# Patient Record
Sex: Male | Born: 1945 | Race: White | Hispanic: No | Marital: Single | Smoking: Former smoker
Health system: Southern US, Community
[De-identification: ages and names within clinical notes are randomized; demographics above are authoritative.]

## PROBLEM LIST (undated history)

## (undated) DIAGNOSIS — D649 Anemia, unspecified: Secondary | ICD-10-CM

## (undated) DIAGNOSIS — I2510 Atherosclerotic heart disease of native coronary artery without angina pectoris: Secondary | ICD-10-CM

## (undated) DIAGNOSIS — I10 Essential (primary) hypertension: Secondary | ICD-10-CM

## (undated) DIAGNOSIS — I340 Nonrheumatic mitral (valve) insufficiency: Secondary | ICD-10-CM

## (undated) DIAGNOSIS — J40 Bronchitis, not specified as acute or chronic: Secondary | ICD-10-CM

## (undated) DIAGNOSIS — E7800 Pure hypercholesterolemia, unspecified: Secondary | ICD-10-CM

## (undated) DIAGNOSIS — I5189 Other ill-defined heart diseases: Secondary | ICD-10-CM

## (undated) DIAGNOSIS — K219 Gastro-esophageal reflux disease without esophagitis: Secondary | ICD-10-CM

## (undated) DIAGNOSIS — Z87891 Personal history of nicotine dependence: Secondary | ICD-10-CM

## (undated) DIAGNOSIS — I70219 Atherosclerosis of native arteries of extremities with intermittent claudication, unspecified extremity: Secondary | ICD-10-CM

## (undated) DIAGNOSIS — M1990 Unspecified osteoarthritis, unspecified site: Secondary | ICD-10-CM

## (undated) DIAGNOSIS — I219 Acute myocardial infarction, unspecified: Secondary | ICD-10-CM

## (undated) DIAGNOSIS — I2119 ST elevation (STEMI) myocardial infarction involving other coronary artery of inferior wall: Secondary | ICD-10-CM

## (undated) HISTORY — PX: TONSILLECTOMY: SUR1361

## (undated) HISTORY — PX: CORONARY ANGIOPLASTY WITH STENT PLACEMENT: SHX49

---

## 1994-03-07 HISTORY — PX: ROTATOR CUFF REPAIR: SHX139

## 2013-03-07 DIAGNOSIS — Z9861 Coronary angioplasty status: Secondary | ICD-10-CM

## 2013-03-07 DIAGNOSIS — Z955 Presence of coronary angioplasty implant and graft: Secondary | ICD-10-CM

## 2013-03-07 HISTORY — DX: Presence of coronary angioplasty implant and graft: Z95.5

## 2013-03-07 HISTORY — DX: Coronary angioplasty status: Z98.61

## 2014-01-05 DIAGNOSIS — I219 Acute myocardial infarction, unspecified: Secondary | ICD-10-CM

## 2014-01-05 HISTORY — DX: Acute myocardial infarction, unspecified: I21.9

## 2014-01-26 DIAGNOSIS — I2119 ST elevation (STEMI) myocardial infarction involving other coronary artery of inferior wall: Secondary | ICD-10-CM | POA: Insufficient documentation

## 2014-02-04 DIAGNOSIS — I2510 Atherosclerotic heart disease of native coronary artery without angina pectoris: Secondary | ICD-10-CM | POA: Insufficient documentation

## 2014-02-04 DIAGNOSIS — K219 Gastro-esophageal reflux disease without esophagitis: Secondary | ICD-10-CM | POA: Insufficient documentation

## 2014-02-04 DIAGNOSIS — I10 Essential (primary) hypertension: Secondary | ICD-10-CM | POA: Insufficient documentation

## 2014-03-04 DIAGNOSIS — R079 Chest pain, unspecified: Secondary | ICD-10-CM | POA: Insufficient documentation

## 2014-03-19 DIAGNOSIS — I340 Nonrheumatic mitral (valve) insufficiency: Secondary | ICD-10-CM | POA: Insufficient documentation

## 2014-06-23 DIAGNOSIS — E782 Mixed hyperlipidemia: Secondary | ICD-10-CM | POA: Insufficient documentation

## 2014-09-06 ENCOUNTER — Other Ambulatory Visit: Payer: Self-pay

## 2014-09-06 ENCOUNTER — Emergency Department
Admission: EM | Admit: 2014-09-06 | Discharge: 2014-09-06 | Disposition: A | Discharge status: Home / Self Care | Payer: Medicare Other | Attending: Emergency Medicine | Admitting: Emergency Medicine

## 2014-09-06 ENCOUNTER — Encounter: Payer: Self-pay | Admitting: Emergency Medicine

## 2014-09-06 DIAGNOSIS — Z87891 Personal history of nicotine dependence: Secondary | ICD-10-CM | POA: Insufficient documentation

## 2014-09-06 DIAGNOSIS — R42 Dizziness and giddiness: Secondary | ICD-10-CM | POA: Diagnosis not present

## 2014-09-06 DIAGNOSIS — I252 Old myocardial infarction: Secondary | ICD-10-CM | POA: Insufficient documentation

## 2014-09-06 DIAGNOSIS — Z7902 Long term (current) use of antithrombotics/antiplatelets: Secondary | ICD-10-CM | POA: Diagnosis not present

## 2014-09-06 DIAGNOSIS — Z7982 Long term (current) use of aspirin: Secondary | ICD-10-CM | POA: Insufficient documentation

## 2014-09-06 DIAGNOSIS — Z79899 Other long term (current) drug therapy: Secondary | ICD-10-CM | POA: Diagnosis not present

## 2014-09-06 HISTORY — DX: Pure hypercholesterolemia, unspecified: E78.00

## 2014-09-06 HISTORY — DX: Acute myocardial infarction, unspecified: I21.9

## 2014-09-06 LAB — COMPREHENSIVE METABOLIC PANEL
ALT: 24 U/L (ref 17–63)
AST: 32 U/L (ref 15–41)
Albumin: 4 g/dL (ref 3.5–5.0)
Alkaline Phosphatase: 68 U/L (ref 38–126)
Anion gap: 6 (ref 5–15)
BUN: 11 mg/dL (ref 6–20)
CO2: 28 mmol/L (ref 22–32)
CREATININE: 0.89 mg/dL (ref 0.61–1.24)
Calcium: 9.3 mg/dL (ref 8.9–10.3)
Chloride: 108 mmol/L (ref 101–111)
GFR calc Af Amer: >60 mL/min (ref >60)
GFR calc non Af Amer: >60 mL/min (ref >60)
GLUCOSE: 100 mg/dL — AB (ref 65–99)
POTASSIUM: 4.3 mmol/L (ref 3.5–5.1)
Sodium: 142 mmol/L (ref 135–145)
TOTAL PROTEIN: 6.8 g/dL (ref 6.5–8.1)
Total Bilirubin: 0.9 mg/dL (ref 0.3–1.2)

## 2014-09-06 LAB — TROPONIN I: Troponin I: <0.03 ng/mL (ref <0.031)

## 2014-09-06 LAB — CBC
HCT: 42.8 % (ref 40.0–52.0)
HEMOGLOBIN: 14 g/dL (ref 13.0–18.0)
MCH: 31.8 pg (ref 26.0–34.0)
MCHC: 32.7 g/dL (ref 32.0–36.0)
MCV: 97.2 fL (ref 80.0–100.0)
Platelets: 192 10*3/uL (ref 150–440)
RBC: 4.4 MIL/uL (ref 4.40–5.90)
RDW: 14 % (ref 11.5–14.5)
WBC: 7.4 10*3/uL (ref 3.8–10.6)

## 2014-09-06 MED ORDER — SODIUM CHLORIDE 0.9 % IV SOLN
1000.0000 mL | Freq: Once | INTRAVENOUS | Status: AC
  Administered 2014-09-06: 1000 mL via INTRAVENOUS

## 2014-09-06 NOTE — ED Notes (Signed)
Patient arrives to [facility] ED with spouse via POV. C/o dizziness and blurred vision. Patient has recent stent placement (Nov 2015) and is on plavix/asa

## 2014-09-06 NOTE — Discharge Instructions (Signed)

## 2014-09-06 NOTE — ED Provider Notes (Signed)
[facility] Emergency Department Provider Note  ____________________________________________  Time seen: On arrival  I have reviewed the triage vital signs and the nursing notes.   HISTORY  Chief Complaint Dizziness    HPI Cody Fritz is a 69 y.o. male who presents with complaints of dizziness. He first noticed this when he got up this morning. When he stood up he felt lightheaded so he rested for a little bit and then went to drink some coffee. He denies chest pain shortness of breath. He notes mild lightheadedness when he stands and reports that he has had this several times in the past 5 years. He denies headache, he denies focal deficits area no fevers no chills. Otherwise he feels well and he is lying down. Reports compliance with his medications     Past Medical History  Diagnosis Date  . High cholesterol   . MI (myocardial infarction)   . Cancer     There are no active problems to display for this patient.   Past Surgical History  Procedure Laterality Date  . Coronary angioplasty with stent placement      Current Outpatient Rx  Name  Route  Sig  Dispense  Refill  . aspirin EC 81 MG tablet   Oral   Take 81 mg by mouth at bedtime.         Marland Kitchen atorvastatin (LIPITOR) 80 MG tablet   Oral   Take 80 mg by mouth at bedtime.         . clopidogrel (PLAVIX) 75 MG tablet   Oral   Take 75 mg by mouth daily.         Marland Kitchen lisinopril (PRINIVIL,ZESTRIL) 5 MG tablet   Oral   Take 5 mg by mouth daily.         . metoprolol tartrate (LOPRESSOR) 25 MG tablet   Oral   Take 6.25 mg by mouth 2 (two) times daily.         Marland Kitchen zolpidem (AMBIEN) 5 MG tablet   Oral   Take 5 mg by mouth at bedtime as needed for sleep.            Allergies Review of patient's allergies indicates no known allergies.  History reviewed. No pertinent family history.  Social History History  Substance Use Topics  . Smoking status: Former Smoker    Quit date:  01/06/2014  . Smokeless tobacco: Not on file  . Alcohol Use: No    Review of Systems  Constitutional: Negative for fever. Eyes: Negative for visual changes. ENT: Negative for sore throat Cardiovascular: Negative for chest pain. Respiratory: Negative for shortness of breath. Gastrointestinal: Negative for abdominal pain, vomiting and diarrhea. Genitourinary: Negative for dysuria. Musculoskeletal: Negative for back pain. Skin: Negative for rash. Neurological: Negative for headaches or focal weakness Psychiatric: No anxiety  10-point ROS otherwise negative.  ____________________________________________   PHYSICAL EXAM:  VITAL SIGNS: ED Triage Vitals  Enc Vitals Group     BP 09/06/14 0942 141/70 mmHg     Pulse Rate 09/06/14 0942 71     Resp 09/06/14 0942 18     Temp 09/06/14 0942 98.3 F (36.8 C)     Temp Source 09/06/14 0942 Oral     SpO2 09/06/14 0942 97 %     Weight 09/06/14 0942 190 lb (86.183 kg)     Height 09/06/14 0942 5\' 9"  (1.753 m)     Head Cir --      Peak Flow --  Pain Score --      Pain Loc --      Pain Edu? --      Excl. in [place]? --      Constitutional: Alert and oriented. Well appearing and in no distress. Eyes: Conjunctivae are normal.  ENT   Head: Normocephalic and atraumatic.   Mouth/Throat: Mucous membranes are moist. Cardiovascular: Normal rate, regular rhythm. Normal and symmetric distal pulses are present in all extremities. No murmurs, rubs, or gallops. Respiratory: Normal respiratory effort without tachypnea nor retractions. Breath sounds are clear and equal bilaterally.  Gastrointestinal: Soft and non-tender in all quadrants. No distention. There is no CVA tenderness. Genitourinary: deferred Musculoskeletal: Nontender with normal range of motion in all extremities. No lower extremity tenderness nor edema. Neurologic:  Normal speech and language. No gross focal neurologic deficits are appreciated. Skin:  Skin is warm, dry and  intact. No rash noted. Psychiatric: Mood and affect are normal. Patient exhibits appropriate insight and judgment.  ____________________________________________    LABS (pertinent positives/negatives)  Labs Reviewed  COMPREHENSIVE METABOLIC PANEL - Abnormal; Notable for the following:    Glucose, Bld 100 (*)    All other components within normal limits  CBC  TROPONIN I    ____________________________________________   EKG  ED ECG REPORT I, Lavonia Drafts, the attending physician, personally viewed and interpreted this ECG.  Date: 09/06/2014 EKG Time: 9:53 AM Rate: 78 Rhythm: normal sinus rhythm QRS Axis: normal Intervals: normal ST/T Wave abnormalities: normal Conduction Disutrbances: none Narrative Interpretation: unremarkable   ____________________________________________    RADIOLOGY I have personally reviewed any xrays that were ordered on this patient:  None  ____________________________________________   PROCEDURES  Procedure(s) performed: none  Critical Care performed: none  ____________________________________________   INITIAL IMPRESSION / ASSESSMENT AND PLAN / ED COURSE  Pertinent labs & imaging results that were available during my care of the patient were reviewed by me and considered in my medical decision making (see chart for details).  Patient well-appearing and in no acute distress. Benign exam. We will obtain blood work and give 1 L normal saline IV and check orthostatics ----------------------------------------- 3:00 PM on 09/06/2014 -----------------------------------------  Patient observed in the ED 4 hours. No arrhythmias on cardiac monitor, normal blood pressure, orthostatics have improved with IV fluid. Patient has no interest in staying in the hospital and I think this is reasonable given normal EKG and normal monitoring, normal troponin, electrolytes, and normal blood work. He will return if there is any change in his  symptoms or worsening of his symptoms otherwise he will follow-up with his primary care physician. ____________________________________________   FINAL CLINICAL IMPRESSION(S) / ED DIAGNOSES  Final diagnoses:  Dizziness     Lavonia Drafts, MD 09/06/14 1501

## 2015-02-19 DIAGNOSIS — M160 Bilateral primary osteoarthritis of hip: Secondary | ICD-10-CM | POA: Insufficient documentation

## 2015-04-01 DIAGNOSIS — M1611 Unilateral primary osteoarthritis, right hip: Secondary | ICD-10-CM | POA: Insufficient documentation

## 2015-06-22 DIAGNOSIS — D649 Anemia, unspecified: Secondary | ICD-10-CM | POA: Insufficient documentation

## 2015-06-22 DIAGNOSIS — E7800 Pure hypercholesterolemia, unspecified: Secondary | ICD-10-CM | POA: Insufficient documentation

## 2015-10-26 DIAGNOSIS — Z9861 Coronary angioplasty status: Secondary | ICD-10-CM

## 2015-10-26 DIAGNOSIS — I2510 Atherosclerotic heart disease of native coronary artery without angina pectoris: Secondary | ICD-10-CM | POA: Insufficient documentation

## 2016-02-10 ENCOUNTER — Other Ambulatory Visit: Payer: Self-pay | Admitting: Family Medicine

## 2016-02-10 DIAGNOSIS — M25512 Pain in left shoulder: Secondary | ICD-10-CM

## 2016-02-11 ENCOUNTER — Ambulatory Visit
Admission: RE | Admit: 2016-02-11 | Discharge: 2016-02-11 | Disposition: A | Discharge status: Home / Self Care | Payer: Medicare Other | Source: Ambulatory Visit | Attending: Family Medicine | Admitting: Family Medicine

## 2016-02-11 DIAGNOSIS — M12812 Other specific arthropathies, not elsewhere classified, left shoulder: Secondary | ICD-10-CM | POA: Diagnosis not present

## 2016-02-11 DIAGNOSIS — R600 Localized edema: Secondary | ICD-10-CM | POA: Diagnosis not present

## 2016-02-11 DIAGNOSIS — M25412 Effusion, left shoulder: Secondary | ICD-10-CM | POA: Diagnosis not present

## 2016-02-11 DIAGNOSIS — S46812A Strain of other muscles, fascia and tendons at shoulder and upper arm level, left arm, initial encounter: Secondary | ICD-10-CM | POA: Diagnosis not present

## 2016-02-11 DIAGNOSIS — X58XXXA Exposure to other specified factors, initial encounter: Secondary | ICD-10-CM | POA: Diagnosis not present

## 2016-02-11 DIAGNOSIS — M75112 Incomplete rotator cuff tear or rupture of left shoulder, not specified as traumatic: Secondary | ICD-10-CM | POA: Insufficient documentation

## 2016-02-11 DIAGNOSIS — M25512 Pain in left shoulder: Secondary | ICD-10-CM | POA: Insufficient documentation

## 2016-02-25 DIAGNOSIS — M25551 Pain in right hip: Secondary | ICD-10-CM

## 2016-02-25 DIAGNOSIS — G8929 Other chronic pain: Secondary | ICD-10-CM | POA: Insufficient documentation

## 2016-03-10 ENCOUNTER — Encounter
Admission: RE | Admit: 2016-03-10 | Discharge: 2016-03-10 | Disposition: A | Discharge status: Home / Self Care | Payer: Medicare Other | Source: Ambulatory Visit | Attending: Surgery | Admitting: Surgery

## 2016-03-10 DIAGNOSIS — I2510 Atherosclerotic heart disease of native coronary artery without angina pectoris: Secondary | ICD-10-CM | POA: Insufficient documentation

## 2016-03-10 HISTORY — DX: Essential (primary) hypertension: I10

## 2016-03-10 HISTORY — DX: Atherosclerotic heart disease of native coronary artery without angina pectoris: I25.10

## 2016-03-10 HISTORY — DX: Anemia, unspecified: D64.9

## 2016-03-10 HISTORY — DX: Gastro-esophageal reflux disease without esophagitis: K21.9

## 2016-03-10 HISTORY — DX: Unspecified osteoarthritis, unspecified site: M19.90

## 2016-03-10 NOTE — Patient Instructions (Signed)
  Your procedure is scheduled on: March 17, 2016 (Thursday) Report to Same Day Surgery 2nd floor medical mall [facility] Entrance-take elevator on left to 2nd floor.  Check in with surgery information desk.) To find out your arrival time please call [phone] between 1PM - 3PM on  March 16, 2016 (Wednesday)  Remember: Instructions that are not followed completely may result in serious medical risk, up to and including death, or upon the discretion of your surgeon and anesthesiologist your surgery may need to be rescheduled.    _x___ 1. Do not eat food or drink liquids after midnight. No gum chewing or hard candies.     __x__ 2. No Alcohol for 24 hours before or after surgery.   __x__3. No Smoking for 24 prior to surgery.   ____  4. Bring all medications with you on the day of surgery if instructed.    __x__ 5. Notify your doctor if there is any change in your medical condition     (cold, fever, infections).     Do not wear jewelry, make-up, hairpins, clips or nail polish.  Do not wear lotions, powders, or perfumes. You may wear deodorant.  Do not shave 48 hours prior to surgery. Men may shave face and neck.  Do not bring valuables to the hospital.    [facility] is not responsible for any belongings or valuables.               Contacts, dentures or bridgework may not be worn into surgery.  Leave your suitcase in the car. After surgery it may be brought to your room.  For patients admitted to the hospital, discharge time is determined by your treatment team.   Patients discharged the day of surgery will not be allowed to drive home.  You will need someone to drive you home and stay with you the night of your procedure.    Please read over the following fact sheets that you were given:   [facility] Preparing for Surgery and or MRSA Information   _x___ Take these medicines the morning of surgery with A SIP OF WATER:    1. Carvedilol  2.  3.  4.  5.  6.  ____Fleets  enema or Magnesium Citrate as directed.   _x___ Use CHG Soap or sage wipes as directed on instruction sheet   ____ Use inhalers on the day of surgery and bring to hospital day of surgery  ____ Stop metformin 2 days prior to surgery    ____ Take 1/2 of usual insulin dose the night before surgery and none on the morning of           surgery.   _x___ Stop Aspirin, Coumadin, Pllavix ,Eliquis, Effient, or Pradaxa (Call Dr. Roland Rack office to find out when to stop Aspirin and Plavix)  x__ Stop Anti-inflammatories such as Advil, Aleve, Ibuprofen, Motrin, Naproxen,          Naprosyn, Goodies powders or aspirin products. Ok to take Tylenol.   __x__ Stop supplements until after surgery.  (Stop Vitamin A, E, CO Q 10, Fish Oil, Men's One A Day , and Glucosamine      ____ Bring C-Pap to the hospital.

## 2016-03-10 NOTE — Pre-Procedure Instructions (Signed)
PREOP EKG FAXED TO DR [name] FOR REVIEW. CLEARED 02/10/16 BY DR [name]

## 2016-03-11 NOTE — Pre-Procedure Instructions (Signed)
EKG REVIEWED BY DR [name] 03/11/16 AND STILL CLEARED  FOR SURGERY

## 2016-03-17 ENCOUNTER — Ambulatory Visit: Payer: Medicare Other | Admitting: Certified Registered Nurse Anesthetist

## 2016-03-17 ENCOUNTER — Ambulatory Visit
Admission: RE | Admit: 2016-03-17 | Discharge: 2016-03-17 | Disposition: A | Discharge status: Home / Self Care | Payer: Medicare Other | Source: Ambulatory Visit | Attending: Surgery | Admitting: Surgery

## 2016-03-17 ENCOUNTER — Encounter: Payer: Self-pay | Admitting: *Deleted

## 2016-03-17 ENCOUNTER — Encounter
Admission: RE | Disposition: A | Discharge status: Home / Self Care | Payer: Self-pay | Source: Ambulatory Visit | Attending: Surgery

## 2016-03-17 DIAGNOSIS — E7800 Pure hypercholesterolemia, unspecified: Secondary | ICD-10-CM | POA: Insufficient documentation

## 2016-03-17 DIAGNOSIS — Z7982 Long term (current) use of aspirin: Secondary | ICD-10-CM | POA: Insufficient documentation

## 2016-03-17 DIAGNOSIS — M1990 Unspecified osteoarthritis, unspecified site: Secondary | ICD-10-CM | POA: Diagnosis not present

## 2016-03-17 DIAGNOSIS — Z87891 Personal history of nicotine dependence: Secondary | ICD-10-CM | POA: Insufficient documentation

## 2016-03-17 DIAGNOSIS — I10 Essential (primary) hypertension: Secondary | ICD-10-CM | POA: Diagnosis not present

## 2016-03-17 DIAGNOSIS — K219 Gastro-esophageal reflux disease without esophagitis: Secondary | ICD-10-CM | POA: Insufficient documentation

## 2016-03-17 DIAGNOSIS — W010XXA Fall on same level from slipping, tripping and stumbling without subsequent striking against object, initial encounter: Secondary | ICD-10-CM | POA: Diagnosis not present

## 2016-03-17 DIAGNOSIS — S46012A Strain of muscle(s) and tendon(s) of the rotator cuff of left shoulder, initial encounter: Secondary | ICD-10-CM | POA: Diagnosis present

## 2016-03-17 DIAGNOSIS — I252 Old myocardial infarction: Secondary | ICD-10-CM | POA: Insufficient documentation

## 2016-03-17 DIAGNOSIS — M7522 Bicipital tendinitis, left shoulder: Secondary | ICD-10-CM | POA: Insufficient documentation

## 2016-03-17 DIAGNOSIS — E785 Hyperlipidemia, unspecified: Secondary | ICD-10-CM | POA: Diagnosis not present

## 2016-03-17 DIAGNOSIS — I2510 Atherosclerotic heart disease of native coronary artery without angina pectoris: Secondary | ICD-10-CM | POA: Insufficient documentation

## 2016-03-17 DIAGNOSIS — M65862 Other synovitis and tenosynovitis, left lower leg: Secondary | ICD-10-CM | POA: Insufficient documentation

## 2016-03-17 HISTORY — PX: SHOULDER ARTHROSCOPY WITH OPEN ROTATOR CUFF REPAIR: SHX6092

## 2016-03-17 SURGERY — ARTHROSCOPY, SHOULDER WITH REPAIR, ROTATOR CUFF, OPEN
Anesthesia: Regional | Site: Shoulder | Laterality: Left | Wound class: Clean

## 2016-03-17 MED ORDER — LIDOCAINE 2% (20 MG/ML) 5 ML SYRINGE
INTRAMUSCULAR | Status: AC
  Filled 2016-03-17: qty 5

## 2016-03-17 MED ORDER — ROPIVACAINE HCL 5 MG/ML IJ SOLN
INTRAMUSCULAR | Status: AC
Start: 2016-03-17 — End: 2016-03-17
  Filled 2016-03-17: qty 40

## 2016-03-17 MED ORDER — BUPIVACAINE HCL (PF) 0.5 % IJ SOLN
INTRAMUSCULAR | Status: AC
  Filled 2016-03-17: qty 30

## 2016-03-17 MED ORDER — DEXAMETHASONE SODIUM PHOSPHATE 10 MG/ML IJ SOLN
INTRAMUSCULAR | Status: AC
  Filled 2016-03-17: qty 1

## 2016-03-17 MED ORDER — BUPIVACAINE-EPINEPHRINE 0.5% -1:200000 IJ SOLN
INTRAMUSCULAR | Status: DC | PRN
Start: 2016-03-17 — End: 2016-03-17
  Administered 2016-03-17: 30 mL

## 2016-03-17 MED ORDER — ONDANSETRON HCL 4 MG PO TABS: 4.0000 mg | ORAL_TABLET | Freq: Four times a day (QID) | ORAL | Status: DC | PRN

## 2016-03-17 MED ORDER — SUGAMMADEX SODIUM 200 MG/2ML IV SOLN
INTRAVENOUS | Status: DC | PRN
  Administered 2016-03-17: 200 mg via INTRAVENOUS

## 2016-03-17 MED ORDER — ROCURONIUM BROMIDE 100 MG/10ML IV SOLN
INTRAVENOUS | Status: DC | PRN
  Administered 2016-03-17: 50 mg via INTRAVENOUS

## 2016-03-17 MED ORDER — EPINEPHRINE PF 1 MG/ML IJ SOLN
INTRAMUSCULAR | Status: AC
  Filled 2016-03-17: qty 3

## 2016-03-17 MED ORDER — PROPOFOL 10 MG/ML IV BOLUS
INTRAVENOUS | Status: AC
  Filled 2016-03-17: qty 20

## 2016-03-17 MED ORDER — EPHEDRINE 5 MG/ML INJ
INTRAVENOUS | Status: AC
  Filled 2016-03-17: qty 10

## 2016-03-17 MED ORDER — FAMOTIDINE 20 MG PO TABS
ORAL_TABLET | ORAL | Status: AC
  Administered 2016-03-17: 20 mg via ORAL
  Filled 2016-03-17: qty 1

## 2016-03-17 MED ORDER — ROCURONIUM BROMIDE 50 MG/5ML IV SOSY
PREFILLED_SYRINGE | INTRAVENOUS | Status: AC
  Filled 2016-03-17: qty 5

## 2016-03-17 MED ORDER — MIDAZOLAM HCL 2 MG/2ML IJ SOLN
INTRAMUSCULAR | Status: AC
  Administered 2016-03-17: 2 mg via INTRAVENOUS
  Filled 2016-03-17: qty 2

## 2016-03-17 MED ORDER — LACTATED RINGERS IV SOLN
INTRAVENOUS | Status: DC | PRN
  Administered 2016-03-17: 10:00:00 via INTRAVENOUS

## 2016-03-17 MED ORDER — MIDAZOLAM HCL 2 MG/2ML IJ SOLN
INTRAMUSCULAR | Status: AC
  Filled 2016-03-17: qty 2

## 2016-03-17 MED ORDER — OXYCODONE HCL 5 MG PO TABS: 5.0000 mg | ORAL_TABLET | ORAL | 0 refills | Status: DC | PRN

## 2016-03-17 MED ORDER — ONDANSETRON HCL 4 MG/2ML IJ SOLN: 4.0000 mg | Freq: Once | INTRAMUSCULAR | Status: DC | PRN

## 2016-03-17 MED ORDER — PHENYLEPHRINE HCL 10 MG/ML IJ SOLN
INTRAMUSCULAR | Status: DC | PRN
  Administered 2016-03-17 (×3): 100 ug via INTRAVENOUS

## 2016-03-17 MED ORDER — MIDAZOLAM HCL 2 MG/2ML IJ SOLN
2.0000 mg | Freq: Once | INTRAMUSCULAR | Status: AC
  Administered 2016-03-17: 2 mg via INTRAVENOUS

## 2016-03-17 MED ORDER — ONDANSETRON HCL 4 MG/2ML IJ SOLN
INTRAMUSCULAR | Status: AC
  Filled 2016-03-17: qty 2

## 2016-03-17 MED ORDER — DEXAMETHASONE SODIUM PHOSPHATE 10 MG/ML IJ SOLN
INTRAMUSCULAR | Status: DC | PRN
  Administered 2016-03-17: 10 mg via INTRAVENOUS

## 2016-03-17 MED ORDER — MIDAZOLAM HCL 2 MG/2ML IJ SOLN
INTRAMUSCULAR | Status: DC | PRN
  Administered 2016-03-17: 2 mg via INTRAVENOUS

## 2016-03-17 MED ORDER — LACTATED RINGERS IV SOLN: INTRAVENOUS | Status: DC

## 2016-03-17 MED ORDER — FAMOTIDINE 20 MG PO TABS
20.0000 mg | ORAL_TABLET | Freq: Once | ORAL | Status: AC
  Administered 2016-03-17: 20 mg via ORAL

## 2016-03-17 MED ORDER — SUGAMMADEX SODIUM 200 MG/2ML IV SOLN
INTRAVENOUS | Status: AC
  Filled 2016-03-17: qty 2

## 2016-03-17 MED ORDER — CEFAZOLIN SODIUM-DEXTROSE 2-4 GM/100ML-% IV SOLN
2.0000 g | Freq: Once | INTRAVENOUS | Status: AC
  Administered 2016-03-17: 2 g via INTRAVENOUS

## 2016-03-17 MED ORDER — EPINEPHRINE PF 1 MG/ML IJ SOLN
INTRAMUSCULAR | Status: DC | PRN
  Administered 2016-03-17: 1 mg

## 2016-03-17 MED ORDER — OXYCODONE HCL 5 MG PO TABS: 5.0000 mg | ORAL_TABLET | ORAL | Status: DC | PRN

## 2016-03-17 MED ORDER — CEFAZOLIN SODIUM-DEXTROSE 2-4 GM/100ML-% IV SOLN
INTRAVENOUS | Status: AC
  Filled 2016-03-17: qty 100

## 2016-03-17 MED ORDER — EPHEDRINE SULFATE 50 MG/ML IJ SOLN
INTRAMUSCULAR | Status: DC | PRN
  Administered 2016-03-17 (×5): 10 mg via INTRAVENOUS

## 2016-03-17 MED ORDER — ONDANSETRON HCL 4 MG/2ML IJ SOLN
INTRAMUSCULAR | Status: DC | PRN
  Administered 2016-03-17: 4 mg via INTRAVENOUS

## 2016-03-17 MED ORDER — ONDANSETRON HCL 4 MG/2ML IJ SOLN: 4.0000 mg | Freq: Four times a day (QID) | INTRAMUSCULAR | Status: DC | PRN

## 2016-03-17 MED ORDER — PROPOFOL 10 MG/ML IV BOLUS
INTRAVENOUS | Status: DC | PRN
Start: 2016-03-17 — End: 2016-03-17
  Administered 2016-03-17: 200 mg via INTRAVENOUS

## 2016-03-17 MED ORDER — LIDOCAINE HCL (PF) 1 % IJ SOLN
INTRAMUSCULAR | Status: AC
  Filled 2016-03-17: qty 5

## 2016-03-17 MED ORDER — FENTANYL CITRATE (PF) 100 MCG/2ML IJ SOLN
INTRAMUSCULAR | Status: AC
  Filled 2016-03-17: qty 2

## 2016-03-17 MED ORDER — LIDOCAINE HCL (PF) 1 % IJ SOLN
INTRAMUSCULAR | Status: DC | PRN
  Administered 2016-03-17: 5 mL via SUBCUTANEOUS

## 2016-03-17 MED ORDER — METOCLOPRAMIDE HCL 5 MG/ML IJ SOLN: 5.0000 mg | Freq: Three times a day (TID) | INTRAMUSCULAR | Status: DC | PRN

## 2016-03-17 MED ORDER — POTASSIUM CHLORIDE IN NACL 20-0.9 MEQ/L-% IV SOLN
INTRAVENOUS | Status: DC
  Filled 2016-03-17 (×3): qty 1000

## 2016-03-17 MED ORDER — PHENYLEPHRINE 40 MCG/ML (10ML) SYRINGE FOR IV PUSH (FOR BLOOD PRESSURE SUPPORT)
PREFILLED_SYRINGE | INTRAVENOUS | Status: AC
  Filled 2016-03-17: qty 10

## 2016-03-17 MED ORDER — ROPIVACAINE HCL 5 MG/ML IJ SOLN
INTRAMUSCULAR | Status: DC | PRN
  Administered 2016-03-17: 30 mL via PERINEURAL

## 2016-03-17 MED ORDER — FENTANYL CITRATE (PF) 100 MCG/2ML IJ SOLN: 25.0000 ug | INTRAMUSCULAR | Status: DC | PRN

## 2016-03-17 MED ORDER — METOCLOPRAMIDE HCL 10 MG PO TABS: 5.0000 mg | ORAL_TABLET | Freq: Three times a day (TID) | ORAL | Status: DC | PRN

## 2016-03-17 MED ORDER — FENTANYL CITRATE (PF) 100 MCG/2ML IJ SOLN
INTRAMUSCULAR | Status: DC | PRN
  Administered 2016-03-17: 100 ug via INTRAVENOUS

## 2016-03-17 MED ORDER — LIDOCAINE HCL (CARDIAC) 20 MG/ML IV SOLN
INTRAVENOUS | Status: DC | PRN
  Administered 2016-03-17: 100 mg via INTRAVENOUS

## 2016-03-17 SURGICAL SUPPLY — 44 items
ANCHOR JUGGERKNOT WTAP NDL 2.9 (Anchor) ×15 IMPLANT
ANCHOR SUT QUATTRO KNTLS 4.5 (Anchor) ×6 IMPLANT
BIT DRILL JUGRKNT W/NDL BIT2.9 (DRILL) ×1 IMPLANT
BLADE FULL RADIUS 3.5 (BLADE) ×3 IMPLANT
BUR ACROMIONIZER 4.0 (BURR) ×3 IMPLANT
CANNULA SHAVER 8MMX76MM (CANNULA) ×3 IMPLANT
CHLORAPREP W/TINT 26ML (MISCELLANEOUS) ×3 IMPLANT
COVER MAYO STAND STRL (DRAPES) ×3 IMPLANT
DRAPE IMP U-DRAPE 54X76 (DRAPES) ×6 IMPLANT
DRILL JUGGERKNOT W/NDL BIT 2.9 (DRILL) ×3
DRSG OPSITE POSTOP 4X8 (GAUZE/BANDAGES/DRESSINGS) ×3 IMPLANT
ELECT REM PT RETURN 9FT ADLT (ELECTROSURGICAL) ×3
ELECTRODE REM PT RTRN 9FT ADLT (ELECTROSURGICAL) ×1 IMPLANT
GAUZE PETRO XEROFOAM 1X8 (MISCELLANEOUS) ×3 IMPLANT
GAUZE SPONGE 4X4 12PLY STRL (GAUZE/BANDAGES/DRESSINGS) ×3 IMPLANT
GLOVE BIO SURGEON STRL SZ7.5 (GLOVE) ×6 IMPLANT
GLOVE BIO SURGEON STRL SZ8 (GLOVE) ×6 IMPLANT
GLOVE BIOGEL PI IND STRL 8 (GLOVE) ×1 IMPLANT
GLOVE BIOGEL PI INDICATOR 8 (GLOVE) ×2
GLOVE INDICATOR 8.0 STRL GRN (GLOVE) ×3 IMPLANT
GOWN STRL REUS W/ TWL LRG LVL3 (GOWN DISPOSABLE) ×1 IMPLANT
GOWN STRL REUS W/ TWL XL LVL3 (GOWN DISPOSABLE) ×1 IMPLANT
GOWN STRL REUS W/TWL LRG LVL3 (GOWN DISPOSABLE) ×2
GOWN STRL REUS W/TWL XL LVL3 (GOWN DISPOSABLE) ×2
GRASPER SUT 15 45D LOW PRO (SUTURE) IMPLANT
IV LACTATED RINGER IRRG 3000ML (IV SOLUTION) ×4
IV LR IRRIG 3000ML ARTHROMATIC (IV SOLUTION) ×2 IMPLANT
MANIFOLD NEPTUNE II (INSTRUMENTS) ×3 IMPLANT
MASK FACE SPIDER DISP (MASK) ×3 IMPLANT
MAT BLUE FLOOR 46X72 FLO (MISCELLANEOUS) ×3 IMPLANT
NEEDLE REVERSE CUT 1/2 CRC (NEEDLE) IMPLANT
PACK ARTHROSCOPY SHOULDER (MISCELLANEOUS) ×3 IMPLANT
SLING ARM LRG DEEP (SOFTGOODS) ×3 IMPLANT
SLING ULTRA II LG (MISCELLANEOUS) ×3 IMPLANT
STAPLER SKIN PROX 35W (STAPLE) ×3 IMPLANT
STRAP SAFETY BODY (MISCELLANEOUS) ×3 IMPLANT
SUT ETHIBOND 0 MO6 C/R (SUTURE) ×3 IMPLANT
SUT VIC AB 2-0 CT1 27 (SUTURE) ×4
SUT VIC AB 2-0 CT1 TAPERPNT 27 (SUTURE) ×2 IMPLANT
TAPE MICROFOAM 4IN (TAPE) ×3 IMPLANT
TUBING ARTHRO INFLOW-ONLY STRL (TUBING) ×3 IMPLANT
TUBING CONNECTING 10 (TUBING) ×2 IMPLANT
TUBING CONNECTING 10' (TUBING) ×1
WAND HAND CNTRL MULTIVAC 90 (MISCELLANEOUS) ×3 IMPLANT

## 2016-03-17 NOTE — Anesthesia Postprocedure Evaluation (Signed)
Anesthesia Post Note  Patient: Jamai Jespersen  Procedure(s) Performed: Procedure(s) (LRB): SHOULDER ARTHROSCOPY WITH OPEN ROTATOR CUFF REPAIR, DEBRIDEMENT DECOMPRESSION, TENDONESIS (Left)  Patient location during evaluation: PACU Anesthesia Type: Regional Level of consciousness: awake and alert and oriented Pain management: pain level controlled Vital Signs Assessment: post-procedure vital signs reviewed and stable Respiratory status: spontaneous breathing, nonlabored ventilation and respiratory function stable Cardiovascular status: blood pressure returned to baseline and stable Postop Assessment: no signs of nausea or vomiting Anesthetic complications: no     Last Vitals:  Vitals:   03/17/16 1303 03/17/16 1318  BP: (!) 155/81 (!) 152/79  Pulse: 69 70  Resp: 14 14  Temp: 36.3 C     Last Pain:  Vitals:   03/17/16 1318  TempSrc:   PainSc: Asleep                 Edis Huish

## 2016-03-17 NOTE — Anesthesia Procedure Notes (Signed)
Anesthesia Regional Block:  Interscalene brachial plexus block  Pre-Anesthetic Checklist: ,, timeout performed, Correct Patient, Correct Site, Correct Laterality, Correct Procedure, Correct Position, site marked, Risks and benefits discussed,  Surgical consent,  Pre-op evaluation,  At surgeon's request and post-op pain management  Laterality: Left and Upper  Prep: alcohol swabs       Needles:  Injection technique: Single-shot  Needle Type: Stimiplex     Needle Length: 5cm 5 cm Needle Gauge: 22 and 22 G    Additional Needles:  Procedures: ultrasound guided (picture in chart) and nerve stimulator Interscalene brachial plexus block Narrative:  Start time: 03/17/2016 9:53 AM End time: 03/17/2016 9:58 AM Injection made incrementally with aspirations every 5 mL.  Performed by: Personally  Anesthesiologist: Martha Clan  Additional Notes: Functioning IV was confirmed and monitors were applied.  A 32mm 22ga Stimuplex needle was used. Sterile prep and drape,hand hygiene and sterile gloves were used.  Negative aspiration and negative test dose prior to incremental administration of local anesthetic. The patient tolerated the procedure well.

## 2016-03-17 NOTE — H&P (Signed)
Paper H&P to be scanned into permanent record. H&P reviewed. No changes. 

## 2016-03-17 NOTE — Transfer of Care (Signed)
Immediate Anesthesia Transfer of Care Note  Patient: Nickolis Yanik  Procedure(s) Performed: Procedure(s): SHOULDER ARTHROSCOPY WITH OPEN ROTATOR CUFF REPAIR, DEBRIDEMENT DECOMPRESSION, TENDONESIS (Left)  Patient Location: PACU  Anesthesia Type:General and Regional  Level of Consciousness: patient cooperative and responds to stimulation  Airway & Oxygen Therapy: Patient Spontanous Breathing and Patient connected to nasal cannula oxygen  Post-op Assessment: Report given to RN and Post -op Vital signs reviewed and stable  Post vital signs: Reviewed and stable  Last Vitals:  Vitals:   03/17/16 1015 03/17/16 1303  BP: 123/70 (!) 155/81  Pulse: 65 69  Resp: 15 14  Temp:  36.3 C    Last Pain:  Vitals:   03/17/16 0914  TempSrc: Tympanic         Complications: No apparent anesthesia complications

## 2016-03-17 NOTE — Op Note (Signed)
03/17/2016  12:58 PM  Patient:   Cody Fritz  Pre-Op Diagnosis:   Massive rotator cuff tear, left shoulder.  Postoperative diagnosis: Massive rotator cuff tear with labral fraying and biceps tendinopathy, left shoulder.  Procedure: Limited arthroscopic debridement, arthroscopic subacromial decompression, mini-open rotator cuff repair, and mini-open biceps tenodesis, left shoulder.  Anesthesia: General endotracheal with interscalene block placed preoperatively by the anesthesiologist.  Surgeon:   Pascal Lux, MD  Assistant:   Cameron Proud, PA-C; Sung Amabile, PA-S  Findings: As above. The rotator cuff tear involved the superior 40% of subscapularis tendon, the entire supraspinatus, and most of the infraspinatus tendon. The biceps tendon demonstrated significant tendinopathic changes with partial tearing of the intra-articular portion of the long head of the biceps tendon. There was moderate fraying of the labrum anteriorly and superiorly. There were only mild degenerative changes of the glenoid and humeral head articular surfaces.  Complications: None  Fluids:   1000 cc  Estimated blood loss: 5 cc  Tourniquet time: None  Drains: None  Closure: Staples   Brief clinical note: The patient is a 71 year old male who sustained the above-noted injury 6 weeks ago when he slipped and fell. However, he does not that he had had shoulder symptoms for quite some time prior to this injury, and is now 20 years status post a right rotator cuff repair. The patient's symptoms have progressed despite medications, activity modification, etc. The patient's history and examination are consistent with impingement/tendinopathy with a rotator cuff tear. These findings were confirmed by MRI scan. The patient presents at this time for definitive management of these shoulder symptoms.  Procedure: The patient underwent placement of an interscalene block by the anesthesiologist  in the preoperative holding area before he was brought into the operating room and lain in the supine position. The patient then underwent general endotracheal intubation and anesthesia before being repositioned in the beach chair position using the beach chair positioner. The left shoulder and upper extremity were prepped with ChloraPrep solution before being draped sterilely. Preoperative antibiotics were administered. A timeout was performed to confirm the proper surgical site before the expected portal sites and incision site were injected with 0.5% Sensorcaine with epinephrine. A posterior portal was created and the glenohumeral joint thoroughly inspected with the findings as described above. An anterior portal was created using an outside-in technique. The labrum and rotator cuff were further probed, again confirming the above-noted findings. Areas of labral fraying and synovitis were debrided back to stable margins using the full-radius resector. The ArthroCare wand was inserted and used to release the biceps from its labral attachment, as well as to obtain hemostasis and to "anneal" the labrum superiorly and anteriorly. The instruments were removed from the joint after suctioning the excess fluid.  The camera was repositioned through the posterior portal into the subacromial space. A separate lateral portal was created using an outside-in technique. The 3.5 mm full-radius resector was introduced and used to perform a subtotal bursectomy. The ArthroCare wand was then inserted and used to remove the periosteal tissue off the undersurface of the anterior third of the acromion as well as to recess the coracoacromial ligament from its attachment along the anterior and lateral margins of the acromion. The 4.0 mm acromionizing bur was introduced and used to complete the decompression by removing the undersurface of the anterior third of the acromion. The full radius resector was reintroduced to remove any  residual bony debris before the ArthroCare wand was reintroduced to obtain hemostasis. The instruments  were then removed from the subacromial space after suctioning the excess fluid.  An approximately 4-5 cm incision was made over the anterolateral aspect of the shoulder beginning at the anterolateral corner of the acromion and extending distally in line with the bicipital groove. This incision was carried down through the subcutaneous tissues to expose the deltoid fascia. The raphae between the anterior and middle thirds was identified and this plane developed to provide access into the subacromial space. Additional bursal tissues were debrided sharply using Metzenbaum scissors. The rotator cuff tear was readily identified. The margins were debrided sharply with a #15 blade and the exposed greater tuberosity roughened with a rongeur. The tear was repaired using four Biomet 2.9 mm JuggerKnot anchors. Several of these sutures were then brought back laterally and secured using two [place] QuatroLink anchors to create a two-layer closure. An apparent watertight closure was obtained, although the anterior portion of the supraspinatus tendon barely reach to the articular margin of the greater tuberosity.  The bicipital groove was identified by palpation and opened for 1-1.5 cm. The biceps tendon stump was retrieved through this defect. The floor of the bicipital groove was roughened with a curet before another Biomet 2.9 mm JuggerKnot anchor was inserted. Both sets of sutures were passed through the biceps tendon and tied securely to effect the tenodesis.   The wound was copiously irrigated with sterile saline solution before the deltoid raphae was reapproximated using 2-0 Vicryl interrupted sutures. The subcutaneous tissues were closed in two layers using 2-0 Vicryl interrupted sutures before the skin was closed using staples. The portal sites also were closed using staples. A sterile bulky dressing was applied to  the shoulder before the arm was placed into a shoulder immobilizer. The patient was then awakened, extubated, and returned to the recovery room in satisfactory condition after tolerating the procedure well.

## 2016-03-17 NOTE — Anesthesia Preprocedure Evaluation (Signed)
Anesthesia Evaluation  Patient identified by MRN, date of birth, ID band Patient awake    Reviewed: Allergy & Precautions, H&P , NPO status , Patient's Chart, lab work & pertinent test results, reviewed documented beta blocker date and time   History of Anesthesia Complications Negative for: history of anesthetic complications  Airway Mallampati: I  TM Distance: >3 FB Neck ROM: full    Dental  (+) Edentulous Upper, Edentulous Lower, Upper Dentures, Lower Dentures   Pulmonary neg pulmonary ROS, former smoker,    Pulmonary exam normal breath sounds clear to auscultation       Cardiovascular Exercise Tolerance: Good hypertension, (-) angina+ CAD, + Past MI and + Cardiac Stents  (-) CABG Normal cardiovascular exam(-) dysrhythmias (-) Valvular Problems/Murmurs Rhythm:regular Rate:Normal     Neuro/Psych negative neurological ROS  negative psych ROS   GI/Hepatic Neg liver ROS, GERD  ,  Endo/Other  negative endocrine ROS  Renal/GU negative Renal ROS  negative genitourinary   Musculoskeletal   Abdominal   Peds  Hematology negative hematology ROS (+)   Anesthesia Other Findings Past Medical History: No date: Anemia No date: Arthritis No date: Coronary artery disease No date: GERD (gastroesophageal reflux disease) No date: High cholesterol No date: Hypertension 01/2014: MI (myocardial infarction)   Reproductive/Obstetrics negative OB ROS                             Anesthesia Physical Anesthesia Plan  ASA: II  Anesthesia Plan: General and Regional   Post-op Pain Management: GA combined w/ Regional for post-op pain   Induction:   Airway Management Planned:   Additional Equipment:   Intra-op Plan:   Post-operative Plan:   Informed Consent: I have reviewed the patients History and Physical, chart, labs and discussed the procedure including the risks, benefits and alternatives for  the proposed anesthesia with the patient or authorized representative who has indicated his/her understanding and acceptance.   Dental Advisory Given  Plan Discussed with: Anesthesiologist, CRNA and Surgeon  Anesthesia Plan Comments:         Anesthesia Quick Evaluation

## 2016-03-17 NOTE — Anesthesia Procedure Notes (Signed)
Procedure Name: Intubation Date/Time: 03/17/2016 10:28 AM Performed by: Darlyne Russian Pre-anesthesia Checklist: Patient identified, Emergency Drugs available, Suction available, Patient being monitored and Timeout performed Patient Re-evaluated:Patient Re-evaluated prior to inductionOxygen Delivery Method: Circle system utilized Preoxygenation: Pre-oxygenation with 100% oxygen Intubation Type: IV induction Ventilation: Mask ventilation without difficulty and Oral airway inserted - appropriate to patient size Laryngoscope Size: Mac and 4 Grade View: Grade I Tube type: Oral Tube size: 7.5 mm Number of attempts: 1 Airway Equipment and Method: Stylet Placement Confirmation: ETT inserted through vocal cords under direct vision,  positive ETCO2 and breath sounds checked- equal and bilateral Secured at: 24 cm Tube secured with: Tape Dental Injury: Teeth and Oropharynx as per pre-operative assessment

## 2016-03-17 NOTE — Discharge Instructions (Signed)
Keep dressing dry and intact.  May remove dressing and bathe with soap and water beginning on post-op day #4 (Monday).  Cover staples with Band-Aids after drying off. Apply ice frequently to shoulder. Take ibuprofen 800 mg TID with meals for 7-10 days, then as necessary. Take oxycodone as prescribed when needed.  May supplement with ES Tylenol if necessary. Keep shoulder immobilizer on at all times except may loosen for bathing purposes. Follow-up in 10-14 days or as scheduled.

## 2016-03-18 DIAGNOSIS — M75122 Complete rotator cuff tear or rupture of left shoulder, not specified as traumatic: Secondary | ICD-10-CM | POA: Insufficient documentation

## 2016-03-18 DIAGNOSIS — S46102A Unspecified injury of muscle, fascia and tendon of long head of biceps, left arm, initial encounter: Secondary | ICD-10-CM | POA: Insufficient documentation

## 2016-03-18 DIAGNOSIS — M24112 Other articular cartilage disorders, left shoulder: Secondary | ICD-10-CM | POA: Insufficient documentation

## 2018-03-07 DIAGNOSIS — I6523 Occlusion and stenosis of bilateral carotid arteries: Secondary | ICD-10-CM

## 2018-03-07 HISTORY — DX: Occlusion and stenosis of bilateral carotid arteries: I65.23

## 2018-03-09 ENCOUNTER — Other Ambulatory Visit: Payer: Self-pay | Admitting: Internal Medicine

## 2018-03-09 DIAGNOSIS — R3121 Asymptomatic microscopic hematuria: Secondary | ICD-10-CM

## 2018-03-09 DIAGNOSIS — Z0000 Encounter for general adult medical examination without abnormal findings: Secondary | ICD-10-CM

## 2018-03-13 DIAGNOSIS — I6523 Occlusion and stenosis of bilateral carotid arteries: Secondary | ICD-10-CM | POA: Insufficient documentation

## 2018-03-16 ENCOUNTER — Ambulatory Visit
Admission: RE | Admit: 2018-03-16 | Discharge: 2018-03-16 | Disposition: A | Discharge status: Home / Self Care | Payer: Medicare Other | Source: Ambulatory Visit | Attending: Internal Medicine | Admitting: Internal Medicine

## 2018-03-16 DIAGNOSIS — R3121 Asymptomatic microscopic hematuria: Secondary | ICD-10-CM | POA: Diagnosis present

## 2018-03-16 DIAGNOSIS — Z0000 Encounter for general adult medical examination without abnormal findings: Secondary | ICD-10-CM | POA: Diagnosis present

## 2018-03-16 MED ORDER — IOPAMIDOL (ISOVUE-300) INJECTION 61%
125.0000 mL | Freq: Once | INTRAVENOUS | Status: AC | PRN
  Administered 2018-03-16: 125 mL via INTRAVENOUS

## 2018-04-05 ENCOUNTER — Ambulatory Visit (INDEPENDENT_AMBULATORY_CARE_PROVIDER_SITE_OTHER): Payer: Medicare Other | Admitting: Urology

## 2018-04-05 ENCOUNTER — Encounter: Payer: Self-pay | Admitting: Urology

## 2018-04-05 ENCOUNTER — Telehealth: Payer: Self-pay | Admitting: Urology

## 2018-04-05 VITALS — BP 165/80 | HR 75 | Ht 68.0 in | Wt 206.4 lb

## 2018-04-05 DIAGNOSIS — R3129 Other microscopic hematuria: Secondary | ICD-10-CM

## 2018-04-05 LAB — URINALYSIS, COMPLETE
BILIRUBIN UA: NEGATIVE
GLUCOSE, UA: NEGATIVE
KETONES UA: NEGATIVE
Leukocytes, UA: NEGATIVE
Nitrite, UA: NEGATIVE
PH UA: 6 (ref 5.0–7.5)
Protein, UA: NEGATIVE
Specific Gravity, UA: 1.02 (ref 1.005–1.030)
UUROB: 0.2 mg/dL (ref 0.2–1.0)

## 2018-04-05 LAB — MICROSCOPIC EXAMINATION
Bacteria, UA: NONE SEEN
Epithelial Cells (non renal): NONE SEEN /hpf (ref 0–10)
WBC, UA: NONE SEEN /hpf (ref 0–5)

## 2018-04-05 NOTE — Telephone Encounter (Signed)
Pt called and states that Dr Diamantina Providence called him and he is returning the call.

## 2018-04-05 NOTE — Patient Instructions (Signed)
Hematuria, Adult  Hematuria is blood in the urine. Blood may be visible in the urine, or it may be identified with a test. This condition can be caused by infections of the bladder, urethra, kidney, or prostate. Other possible causes include:   Kidney stones.   Cancer of the urinary tract.   Too much calcium in the urine.   Conditions that are passed from parent to child (inherited conditions).   Exercise that requires a lot of energy.  Infections can usually be treated with medicine, and a kidney stone usually will pass through your urine. If neither of these is the cause of your hematuria, more tests may be needed to identify the cause of your symptoms.  It is very important to tell your health care provider about any blood in your urine, even if it is painless or the blood stops without treatment. Blood in the urine, when it happens and then stops and then happens again, can be a symptom of a very serious condition, including cancer. There is no pain in the initial stages of many urinary cancers.  Follow these instructions at home:  Medicines   Take over-the-counter and prescription medicines only as told by your health care provider.   If you were prescribed an antibiotic medicine, take it as told by your health care provider. Do not stop taking the antibiotic even if you start to feel better.  Eating and drinking   Drink enough fluid to keep your urine clear or pale yellow. It is recommended that you drink 3-4 quarts (2.8-3.8 L) a day. If you have been diagnosed with an infection, it is recommended that you drink cranberry juice in addition to large amounts of water.   Avoid caffeine, tea, and carbonated beverages. These tend to irritate the bladder.   Avoid alcohol because it may irritate the prostate (men).  General instructions   If you have been diagnosed with a kidney stone, follow your health care provider's instructions about straining your urine to catch the stone.   Empty your bladder  often. Avoid holding urine for long periods of time.   If you are male:  ? After a bowel movement, wipe from front to back and use each piece of toilet paper only once.  ? Empty your bladder before and after sex.   Pay attention to any changes in your symptoms. Tell your health care provider about any changes or any new symptoms.   It is your responsibility to get your test results. Ask your health care provider, or the department performing the test, when your results will be ready.   Keep all follow-up visits as told by your health care provider. This is important.  Contact a health care provider if:   You develop back pain.   You have a fever.   You have nausea or vomiting.   Your symptoms do not improve after 3 days.   Your symptoms get worse.  Get help right away if:   You develop severe vomiting and are unable take medicine without vomiting.   You develop severe pain in your back or abdomen even though you are taking medicine.   You pass a large amount of blood in your urine.   You pass blood clots in your urine.   You feel very weak or like you might faint.   You faint.  Summary   Hematuria is blood in the urine. It has many possible causes.   It is very important that you tell   your health care provider about any blood in your urine, even if it is painless or the blood stops without treatment.   Take over-the-counter and prescription medicines only as told by your health care provider.   Drink enough fluid to keep your urine clear or pale yellow.  This information is not intended to replace advice given to you by your health care provider. Make sure you discuss any questions you have with your health care provider.  Document Released: 02/21/2005 Document Revised: 03/26/2016 Document Reviewed: 03/26/2016  Elsevier Interactive Patient Education  2019 [employer]

## 2018-04-05 NOTE — Progress Notes (Signed)
04/05/2018 12:36 PM   Cody Kettle Jr. 1945/04/15 [id number]  Referring provider: Glendon Axe, MD [address] [place], [place] [address]  CC: Microscopic hematuria  HPI: I saw Cody Fritz in urology clinic today in consultation for microscopic hematuria from Dr. Candiss Norse.  He is a 73 year old male with extensive history of tobacco use with 80-pack-year smoking history, as well as history of CAD with stent placement on aspirin and Plavix.  He has had multiple episodes of microscopic hematuria with the severity of 4-10 RBCs per high-power field.  He denies any history of gross hematuria.  He denies any significant urinary symptoms including urgency, frequency, feeling of incomplete emptying, or weak stream.  He denies any other carcinogenic exposures aside from smoking.  He has no prior abdominal surgeries.  There is no family history of bladder or kidney cancer.  There are no aggravating or alleviating factors.  Severity is mild.  His CT urogram was performed on 03/16/2018.    PMH: Past Medical History:  Diagnosis Date  . Anemia   . Arthritis   . Coronary artery disease   . GERD (gastroesophageal reflux disease)   . High cholesterol   . Hypertension   . MI (myocardial infarction) ([facility]) 01/2014    Surgical History: Past Surgical History:  Procedure Laterality Date  . CORONARY ANGIOPLASTY WITH STENT PLACEMENT    . ROTATOR CUFF REPAIR Right 1996  . SHOULDER ARTHROSCOPY WITH OPEN ROTATOR CUFF REPAIR Left 03/17/2016   Procedure: SHOULDER ARTHROSCOPY WITH OPEN ROTATOR CUFF REPAIR, DEBRIDEMENT DECOMPRESSION, TENDONESIS;  Surgeon: Corky Mull, MD;  Location: [facility] ORS;  Service: Orthopedics;  Laterality: Left;  . TONSILLECTOMY      Allergies:  Allergies  Allergen Reactions  . Lisinopril Swelling    Family History: Family History  Problem Relation Age of Onset  . Hypertension Mother     Social History:  reports that he quit smoking about 4 years  ago. His smoking use included cigarettes. He smoked 1.00 pack per day. He has never used smokeless tobacco. He reports that he does not drink alcohol or use drugs.  ROS: Please see flowsheet from today's date for complete review of systems.  Physical Exam: BP (!) 165/80 (BP Location: Left Arm, Patient Position: Sitting, Cuff Size: Normal)   Pulse 75   Ht 5\' 8"  (1.727 m)   Wt 206 lb 6.4 oz (93.6 kg)   BMI 31.38 kg/m    Constitutional:  Alert and oriented, No acute distress. Cardiovascular: No clubbing, cyanosis, or edema. Respiratory: Normal respiratory effort, no increased work of breathing. GI: Abdomen is soft, nontender, nondistended, no abdominal masses GU: No CVA tenderness Lymph: No cervical or inguinal lymphadenopathy. Skin: No rashes, bruises or suspicious lesions. Neurologic: Grossly intact, no focal deficits, moving all 4 extremities. Psychiatric: Normal mood and affect.  Laboratory Data: Urinalysis today 0 WBCs, 3-10 RBCs, no bacteria, nitrite negative  Pertinent Imaging: I have personally reviewed the CT urogram dated 03/16/2018.  There is no urolithiasis, hydronephrosis, renal masses, or upper tract filling defects.  There is a filling defect at the anterior wall of the bladder worrisome for possible urothelial malignancy.  Assessment & Plan:   In summary, the patient is a 73 year old male with 80-pack-year smoking history, CAD on aspirin and Plavix, with microscopic hematuria and possible 3 cm anterior wall filling defect worrisome for possible bladder tumor on my read of the images.  Per the radiologist read, this could simply represent a ureteral jet of contrast  not a bladder tumor.  However, I strongly recommended completing hematuria work-up with cystoscopy per the AUA guidelines.  The patient declined cystoscopy at this time, and is "resistant to any unnecessary procedures."  A cytology was sent today.  We discussed the risks of missing a bladder cancer at length  including bleeding, metastasis, and even death.  -The patient is willing to undergo TURBT in the operating room if cytology is positive.  If cytology is negative, I will discuss with him again the risks of deferring cystoscopy, and try to schedule flexible cystoscopy to rule out bladder mass.   Billey Co, [facility] [address] [place], [place] [address] [phone]

## 2018-04-05 NOTE — Telephone Encounter (Signed)
Spoke to [place] and he will call patient after clinic.

## 2018-04-09 ENCOUNTER — Other Ambulatory Visit: Payer: Self-pay | Admitting: Urology

## 2018-04-10 ENCOUNTER — Telehealth: Payer: Self-pay | Admitting: Urology

## 2018-04-10 NOTE — Telephone Encounter (Signed)
App made and patient is aware ° °Cody Fritz °

## 2018-04-10 NOTE — Telephone Encounter (Signed)
-----   Message from Billey Co, MD sent at 04/09/2018  5:07 PM EST ----- Regarding: follow up His urine cytology showed some abnormal cells worrisome for possible bladder tumor.  Please schedule cystoscopy ASAP to look in the bladder in clinic, ok to overbook.  [name], MD 04/09/2018

## 2018-04-12 ENCOUNTER — Encounter: Payer: Self-pay | Admitting: Urology

## 2018-04-12 ENCOUNTER — Ambulatory Visit (INDEPENDENT_AMBULATORY_CARE_PROVIDER_SITE_OTHER): Payer: Medicare Other | Admitting: Urology

## 2018-04-12 VITALS — BP 157/82 | HR 83 | Ht 68.0 in | Wt 206.0 lb

## 2018-04-12 DIAGNOSIS — R3129 Other microscopic hematuria: Secondary | ICD-10-CM | POA: Diagnosis not present

## 2018-04-12 LAB — URINALYSIS, COMPLETE
BILIRUBIN UA: NEGATIVE
Glucose, UA: NEGATIVE
KETONES UA: NEGATIVE
NITRITE UA: NEGATIVE
PH UA: 5.5 (ref 5.0–7.5)
Protein, UA: NEGATIVE
Specific Gravity, UA: 1.015 (ref 1.005–1.030)
UUROB: 0.2 mg/dL (ref 0.2–1.0)

## 2018-04-12 LAB — MICROSCOPIC EXAMINATION
Bacteria, UA: NONE SEEN
EPITHELIAL CELLS (NON RENAL): NONE SEEN /HPF (ref 0–10)

## 2018-04-12 NOTE — Progress Notes (Signed)
Cystoscopy Procedure Note:  Indication: Microscopic hematuria  After informed consent and discussion of the procedure and its risks, Cody Fritz. was positioned and prepped in the standard fashion. Cystoscopy was performed with a flexible cystoscope. The urethra, bladder neck and entire bladder was visualized in a standard fashion. The prostate was large with coapting lobes and a high bladder neck. The ureteral orifices were visualized in their normal location and orientation. On retroflexion, there was a small 24mm papillary tumor at the base of the bladder.  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

## 2018-04-16 ENCOUNTER — Other Ambulatory Visit: Payer: Self-pay

## 2018-04-16 ENCOUNTER — Encounter
Admission: RE | Admit: 2018-04-16 | Discharge: 2018-04-16 | Disposition: A | Discharge status: Home / Self Care | Payer: Medicare Other | Source: Ambulatory Visit | Attending: Urology | Admitting: Urology

## 2018-04-16 DIAGNOSIS — Z7982 Long term (current) use of aspirin: Secondary | ICD-10-CM | POA: Diagnosis not present

## 2018-04-16 DIAGNOSIS — N3289 Other specified disorders of bladder: Secondary | ICD-10-CM | POA: Diagnosis not present

## 2018-04-16 DIAGNOSIS — R3129 Other microscopic hematuria: Secondary | ICD-10-CM | POA: Diagnosis present

## 2018-04-16 DIAGNOSIS — C679 Malignant neoplasm of bladder, unspecified: Secondary | ICD-10-CM | POA: Diagnosis not present

## 2018-04-16 DIAGNOSIS — Z87891 Personal history of nicotine dependence: Secondary | ICD-10-CM | POA: Diagnosis not present

## 2018-04-16 DIAGNOSIS — I252 Old myocardial infarction: Secondary | ICD-10-CM | POA: Diagnosis not present

## 2018-04-16 DIAGNOSIS — Z01812 Encounter for preprocedural laboratory examination: Secondary | ICD-10-CM | POA: Insufficient documentation

## 2018-04-16 DIAGNOSIS — I10 Essential (primary) hypertension: Secondary | ICD-10-CM | POA: Diagnosis not present

## 2018-04-16 DIAGNOSIS — Z955 Presence of coronary angioplasty implant and graft: Secondary | ICD-10-CM | POA: Diagnosis not present

## 2018-04-16 DIAGNOSIS — I2510 Atherosclerotic heart disease of native coronary artery without angina pectoris: Secondary | ICD-10-CM | POA: Diagnosis not present

## 2018-04-16 LAB — URINALYSIS, ROUTINE W REFLEX MICROSCOPIC
BACTERIA UA: NONE SEEN
Bilirubin Urine: NEGATIVE
GLUCOSE, UA: NEGATIVE mg/dL
KETONES UR: NEGATIVE mg/dL
Leukocytes, UA: NEGATIVE
Nitrite: NEGATIVE
PROTEIN: NEGATIVE mg/dL
SQUAMOUS EPITHELIAL / LPF: NONE SEEN (ref 0–5)
Specific Gravity, Urine: 1.006 (ref 1.005–1.030)
pH: 6 (ref 5.0–8.0)

## 2018-04-16 LAB — BASIC METABOLIC PANEL
Anion gap: 6 (ref 5–15)
BUN: 12 mg/dL (ref 8–23)
CHLORIDE: 106 mmol/L (ref 98–111)
CO2: 28 mmol/L (ref 22–32)
Calcium: 9 mg/dL (ref 8.9–10.3)
Creatinine, Ser: 0.84 mg/dL (ref 0.61–1.24)
GFR calc Af Amer: >60 mL/min (ref >60)
GFR calc non Af Amer: >60 mL/min (ref >60)
GLUCOSE: 96 mg/dL (ref 70–99)
Potassium: 4.1 mmol/L (ref 3.5–5.1)
SODIUM: 140 mmol/L (ref 135–145)

## 2018-04-16 LAB — CBC
HCT: 43.7 % (ref 39.0–52.0)
HEMOGLOBIN: 14.3 g/dL (ref 13.0–17.0)
MCH: 32.2 pg (ref 26.0–34.0)
MCHC: 32.7 g/dL (ref 30.0–36.0)
MCV: 98.4 fL (ref 80.0–100.0)
Platelets: 197 10*3/uL (ref 150–400)
RBC: 4.44 MIL/uL (ref 4.22–5.81)
RDW: 12.8 % (ref 11.5–15.5)
WBC: 7.2 10*3/uL (ref 4.0–10.5)
nRBC: 0 % (ref 0.0–0.2)

## 2018-04-16 NOTE — Pre-Procedure Instructions (Addendum)
PER LEAH AT DR [facility] PATIENT SEEING DR [name] 04/17/18. PATIENT DENIES. LEAH NOTIFIED AND WILL FAX CARDIAC CLEARANCE

## 2018-04-16 NOTE — Patient Instructions (Signed)
  Your procedure is scheduled on: Wednesday April 16, 2018 Report to Same Day Surgery 2nd floor [facility] Entrance-take elevator on left to 2nd floor.  Check in with surgery information desk.) To find out your arrival time, call [phone] 1:00-3:00 PM on Tuesday April 15, 2018  Remember: Instructions that are not followed completely may result in serious medical risk, up to and including death, or upon the discretion of your surgeon and anesthesiologist your surgery may need to be rescheduled.    __x__ 1. Do not eat food (including mints, candies, chewing gum) after midnight the night before your procedure. You may drink clear liquids up to 2 hours before you are scheduled to arrive at the hospital for your procedure.  Do not drink anything within 2 hours of your scheduled arrival to the hospital.  Approved clear liquids:  --Water or Apple juice without pulp  --Clear carbohydrate beverage such as Gatorade or Powerade  --Black Coffee or Clear Tea (No milk, no creamers, do not add anything to the coffee or tea)    __x__ 2. No Alcohol for 24 hours before or after surgery.   __x__ 3. No Smoking or e-cigarettes for 24 hours before surgery.  Do not use any chewable tobacco products for at least 6 hours before surgery.   __x__ 4. Notify your doctor if there is any change in your medical condition (cold, fever, infections).   __x__ 5. On the morning of surgery brush your teeth with toothpaste and water.  You may rinse your mouth with mouthwash if you wish.  Do not swallow any toothpaste or mouthwash.   __x__ Use antibacterial soap such as Dial to shower/bathe on the day of surgery.   Do not wear jewelry on the day of surgery.  Do not wear lotions, powders, deodorant, or perfumes.   Do not shave below the face/neck 48 hours prior to surgery.   Do not bring valuables to the hospital.    [facility] is not responsible for any belongings or valuables.    Dentures or bridgework may not be worn into surgery.  For patients discharged on the day of surgery, you will NOT be permitted to drive yourself home.  You must have a responsible adult with you for 24 hours after surgery.  __x__ Take these medicines on the morning of surgery with a SMALL SIP OF WATER:  1. Carvedilol (Coreg)  __x__ Follow recommendations from Cardiologist, Pulmonologist or PCP regarding stopping Aspirin, Coumadin, Plavix, Eliquis, Effient, Pradaxa, and Pletal.  __x__ TODAY: Stop Anti-inflammatories such as Advil, Ibuprofen, Motrin, Aleve, Naproxen, Naprosyn, BC/Goodies powders or aspirin products. You may continue to take Tylenol and Celebrex.   __x__ TODAY: Stop supplements until after surgery. You may continue to take Vitamin D, Vitamin B, and multivitamin.

## 2018-04-17 ENCOUNTER — Other Ambulatory Visit: Payer: Self-pay | Admitting: Urology

## 2018-04-17 MED ORDER — CEFAZOLIN SODIUM-DEXTROSE 2-4 GM/100ML-% IV SOLN: 2.0000 g | Freq: Once | INTRAVENOUS | Status: DC

## 2018-04-18 ENCOUNTER — Ambulatory Visit: Payer: Medicare Other | Admitting: Certified Registered Nurse Anesthetist

## 2018-04-18 ENCOUNTER — Encounter: Payer: Self-pay | Admitting: Certified Registered Nurse Anesthetist

## 2018-04-18 ENCOUNTER — Ambulatory Visit
Admission: RE | Admit: 2018-04-18 | Discharge: 2018-04-18 | Disposition: A | Discharge status: Home / Self Care | Payer: Medicare Other | Attending: Urology | Admitting: Urology

## 2018-04-18 ENCOUNTER — Encounter
Admission: RE | Disposition: A | Discharge status: Home / Self Care | Payer: Self-pay | Source: Home / Self Care | Attending: Urology

## 2018-04-18 ENCOUNTER — Other Ambulatory Visit: Payer: Self-pay

## 2018-04-18 DIAGNOSIS — Z955 Presence of coronary angioplasty implant and graft: Secondary | ICD-10-CM | POA: Insufficient documentation

## 2018-04-18 DIAGNOSIS — D494 Neoplasm of unspecified behavior of bladder: Secondary | ICD-10-CM

## 2018-04-18 DIAGNOSIS — I2510 Atherosclerotic heart disease of native coronary artery without angina pectoris: Secondary | ICD-10-CM | POA: Insufficient documentation

## 2018-04-18 DIAGNOSIS — Z7982 Long term (current) use of aspirin: Secondary | ICD-10-CM | POA: Insufficient documentation

## 2018-04-18 DIAGNOSIS — I10 Essential (primary) hypertension: Secondary | ICD-10-CM | POA: Insufficient documentation

## 2018-04-18 DIAGNOSIS — C679 Malignant neoplasm of bladder, unspecified: Secondary | ICD-10-CM | POA: Insufficient documentation

## 2018-04-18 DIAGNOSIS — Z87891 Personal history of nicotine dependence: Secondary | ICD-10-CM | POA: Insufficient documentation

## 2018-04-18 DIAGNOSIS — N3289 Other specified disorders of bladder: Secondary | ICD-10-CM | POA: Insufficient documentation

## 2018-04-18 DIAGNOSIS — I252 Old myocardial infarction: Secondary | ICD-10-CM | POA: Insufficient documentation

## 2018-04-18 HISTORY — PX: CYSTOSCOPY WITH FULGERATION: SHX6638

## 2018-04-18 HISTORY — PX: CYSTOSCOPY WITH BIOPSY: SHX5122

## 2018-04-18 SURGERY — CYSTOSCOPY, WITH BIOPSY
Anesthesia: General | Site: Bladder

## 2018-04-18 MED ORDER — LACTATED RINGERS IV SOLN
INTRAVENOUS | Status: DC
  Administered 2018-04-18: 13:00:00 via INTRAVENOUS

## 2018-04-18 MED ORDER — FENTANYL CITRATE (PF) 100 MCG/2ML IJ SOLN
INTRAMUSCULAR | Status: DC | PRN
  Administered 2018-04-18 (×2): 25 ug via INTRAVENOUS

## 2018-04-18 MED ORDER — DEXAMETHASONE SODIUM PHOSPHATE 10 MG/ML IJ SOLN
INTRAMUSCULAR | Status: AC
  Filled 2018-04-18: qty 1

## 2018-04-18 MED ORDER — FAMOTIDINE 20 MG PO TABS
ORAL_TABLET | ORAL | Status: AC
  Administered 2018-04-18: 20 mg via ORAL
  Filled 2018-04-18: qty 1

## 2018-04-18 MED ORDER — GEMCITABINE CHEMO FOR BLADDER INSTILLATION 2000 MG
2000.0000 mg | Freq: Once | INTRAVENOUS | Status: AC
  Administered 2018-04-18: 2000 mg via INTRAVESICAL

## 2018-04-18 MED ORDER — CEFAZOLIN SODIUM-DEXTROSE 2-3 GM-%(50ML) IV SOLR
INTRAVENOUS | Status: DC | PRN
  Administered 2018-04-18: 2 g via INTRAVENOUS

## 2018-04-18 MED ORDER — FAMOTIDINE 20 MG PO TABS
20.0000 mg | ORAL_TABLET | Freq: Once | ORAL | Status: AC
  Administered 2018-04-18: 20 mg via ORAL

## 2018-04-18 MED ORDER — PROPOFOL 10 MG/ML IV BOLUS
INTRAVENOUS | Status: AC
  Filled 2018-04-18: qty 20

## 2018-04-18 MED ORDER — DEXAMETHASONE SODIUM PHOSPHATE 10 MG/ML IJ SOLN
INTRAMUSCULAR | Status: DC | PRN
  Administered 2018-04-18: 5 mg via INTRAVENOUS

## 2018-04-18 MED ORDER — FENTANYL CITRATE (PF) 100 MCG/2ML IJ SOLN
INTRAMUSCULAR | Status: AC
  Filled 2018-04-18: qty 2

## 2018-04-18 MED ORDER — FENTANYL CITRATE (PF) 100 MCG/2ML IJ SOLN: 25.0000 ug | INTRAMUSCULAR | Status: DC | PRN

## 2018-04-18 MED ORDER — ONDANSETRON HCL 4 MG/2ML IJ SOLN: 4.0000 mg | Freq: Once | INTRAMUSCULAR | Status: DC | PRN

## 2018-04-18 MED ORDER — LIDOCAINE HCL (PF) 2 % IJ SOLN
INTRAMUSCULAR | Status: AC
  Filled 2018-04-18: qty 10

## 2018-04-18 MED ORDER — LIDOCAINE HCL (CARDIAC) PF 100 MG/5ML IV SOSY
PREFILLED_SYRINGE | INTRAVENOUS | Status: DC | PRN
  Administered 2018-04-18: 50 mg via INTRAVENOUS

## 2018-04-18 MED ORDER — PROPOFOL 10 MG/ML IV BOLUS
INTRAVENOUS | Status: DC | PRN
  Administered 2018-04-18: 160 mg via INTRAVENOUS

## 2018-04-18 MED ORDER — ONDANSETRON HCL 4 MG/2ML IJ SOLN
INTRAMUSCULAR | Status: DC | PRN
  Administered 2018-04-18: 4 mg via INTRAVENOUS

## 2018-04-18 MED ORDER — ONDANSETRON HCL 4 MG/2ML IJ SOLN
INTRAMUSCULAR | Status: AC
  Filled 2018-04-18: qty 2

## 2018-04-18 MED ORDER — CEFAZOLIN SODIUM-DEXTROSE 2-4 GM/100ML-% IV SOLN
INTRAVENOUS | Status: AC
  Filled 2018-04-18: qty 100

## 2018-04-18 SURGICAL SUPPLY — 21 items
BAG DRAIN CYSTO-URO LG1000N (MISCELLANEOUS) ×3 IMPLANT
BRUSH SCRUB EZ  4% CHG (MISCELLANEOUS) ×2
BRUSH SCRUB EZ 4% CHG (MISCELLANEOUS) ×1 IMPLANT
DRSG TELFA 4X3 1S NADH ST (GAUZE/BANDAGES/DRESSINGS) ×3 IMPLANT
ELECT REM PT RETURN 9FT ADLT (ELECTROSURGICAL) ×3
ELECTRODE REM PT RTRN 9FT ADLT (ELECTROSURGICAL) ×1 IMPLANT
GLOVE BIOGEL PI IND STRL 7.5 (GLOVE) ×1 IMPLANT
GLOVE BIOGEL PI INDICATOR 7.5 (GLOVE) ×2
GOWN STRL REUS W/ TWL LRG LVL3 (GOWN DISPOSABLE) ×1 IMPLANT
GOWN STRL REUS W/ TWL XL LVL3 (GOWN DISPOSABLE) ×1 IMPLANT
GOWN STRL REUS W/TWL LRG LVL3 (GOWN DISPOSABLE) ×2
GOWN STRL REUS W/TWL XL LVL3 (GOWN DISPOSABLE) ×2
GUIDEWIRE STR DUAL SENSOR (WIRE) IMPLANT
KIT TURNOVER CYSTO (KITS) ×3 IMPLANT
PACK CYSTO AR (MISCELLANEOUS) ×3 IMPLANT
SET CYSTO W/LG BORE CLAMP LF (SET/KITS/TRAYS/PACK) ×3 IMPLANT
SOL .9 NS 3000ML IRR  AL (IV SOLUTION) ×2
SOL .9 NS 3000ML IRR UROMATIC (IV SOLUTION) ×1 IMPLANT
SURGILUBE 2OZ TUBE FLIPTOP (MISCELLANEOUS) ×3 IMPLANT
WATER STERILE IRR 1000ML POUR (IV SOLUTION) ×3 IMPLANT
WATER STERILE IRR 3000ML UROMA (IV SOLUTION) ×3 IMPLANT

## 2018-04-18 NOTE — Op Note (Signed)
Date of procedure: 04/18/18  Preoperative diagnosis:  1. Bladder tumor, <1cm  Postoperative diagnosis:  1. Same  Procedure: 1. Cystoscopy, bladder biopsy, fulguration  Surgeon: [name], MD  Anesthesia: General  Complications: None  Intraoperative findings:  1.  Moderate size prostate with high bladder neck, mild bladder trabeculations 2.  Small 5 mm papillary tumor on narrow stalk just lateral and superior to the right ureteral orifice, removed entirely with cold cup biopsy forceps 3.  Excellent hemostasis, ureteral orifices effluxed urine bilaterally  EBL: Minimal  Specimens:  1.  Bladder tumor superficial 2.  Bladder tumor deep  Drains: 54 French two-way Foley  Indication: Cody Fritz. is a 73 y.o. patient with microscopic hematuria and small 5 mm bladder tumor at the trigone on clinic cystoscopy.  Cytology showed atypical cells.  After reviewing the management options for treatment, they elected to proceed with the above surgical procedure(s). We have discussed the potential benefits and risks of the procedure, side effects of the proposed treatment, the likelihood of the patient achieving the goals of the procedure, and any potential problems that might occur during the procedure or recuperation. Informed consent has been obtained.  Description of procedure:  The patient was taken to the operating room and general anesthesia was induced.  The patient was placed in the dorsal lithotomy position, prepped and draped in the usual sterile fashion, and preoperative antibiotics were administered. A preoperative time-out was performed.   A 21 French rigid cystoscope was used to intubate the urethra.  Normal-appearing urethra was followed proximally into the bladder.  The prostate was moderate in size.  The ureteral orifices were orthotopic bilaterally.  A 30 and 70 degree lens were used to thoroughly inspect the bladder.  There was a 5 mm papillary tumor on a small stalk  just lateral and superior to the right ureteral orifice.  This was removed in its entirety using cold cup biopsy forceps.  Two deep biopsies were then performed at the base.  The Bugbee was used to obtain excellent hemostasis, carefully avoiding the ureteral orifice.  Under low flow conditions, there was excellent hemostasis in both ureteral orifices effluxed urine bilaterally.  An 2 French coud Foley passed easily into the bladder.  Disposition: Stable to PACU  2000 mg / 50 mL gemcitabine were instilled into the bladder in PACU and clamped.  This was allowed to indwell for 1 hour, then the bladder was drained and Foley removed.  Plan: Unclamp Foley after 1 hour of gemcitabine, then remove catheter.  Patient must void prior to discharge Follow-up in 10 to 14 days to discuss pathology  [name], MD

## 2018-04-18 NOTE — Discharge Instructions (Signed)

## 2018-04-18 NOTE — H&P (Signed)
UROLOGY H&P UPDATE  Agree with prior H&P dated 04/05/2018  Cardiac: RRR Lungs: CTA bilaterally  Laterality: N/A Procedure: Cystoscopy, bladder biopsy, fulguration  Urine: Urinalysis 2/10 no bacteria, nitrite negative, 0-5 WBCs, 0-5 RBCs  Informed consent obtained, we specifically discussed the risks of bleeding, infection, post-operative pain, need for additional procedures, further treatment based on staging, possible foley placement, possible post op gemcitabine.  Billey Co, MD 04/18/2018

## 2018-04-18 NOTE — Transfer of Care (Signed)
Immediate Anesthesia Transfer of Care Note  Patient: Cody Fritz.  Procedure(s) Performed: CYSTOSCOPY WITH BLADDER BIOPSY (N/A Bladder) CYSTOSCOPY WITH FULGERATION (N/A Bladder)  Patient Location: PACU  Anesthesia Type:General  Level of Consciousness: drowsy  Airway & Oxygen Therapy: Patient Spontanous Breathing and Patient connected to face mask oxygen  Post-op Assessment: Report given to RN and Post -op Vital signs reviewed and unstable, Anesthesiologist notified  Post vital signs: Reviewed and stable  Last Vitals:  Vitals Value Taken Time  BP 145/66 04/18/2018  2:06 PM  Temp 97.2   Pulse 61 04/18/2018  2:08 PM  Resp 11 04/18/2018  2:08 PM  SpO2 100 % 04/18/2018  2:08 PM  Vitals shown include unvalidated device data.  Last Pain:  Vitals:   04/18/18 1227  TempSrc: Temporal  PainSc: 0-No pain         Complications: No apparent anesthesia complications

## 2018-04-18 NOTE — Anesthesia Postprocedure Evaluation (Signed)
Anesthesia Post Note  Patient: Xaidyn Kepner.  Procedure(s) Performed: CYSTOSCOPY WITH BLADDER BIOPSY (N/A Bladder) CYSTOSCOPY WITH FULGERATION (N/A Bladder)  Patient location during evaluation: PACU Anesthesia Type: General Level of consciousness: awake and alert Pain management: pain level controlled Vital Signs Assessment: post-procedure vital signs reviewed and stable Respiratory status: spontaneous breathing, nonlabored ventilation, respiratory function stable and patient connected to nasal cannula oxygen Cardiovascular status: blood pressure returned to baseline and stable Postop Assessment: no apparent nausea or vomiting Anesthetic complications: no     Last Vitals:  Vitals:   04/18/18 1506 04/18/18 1520  BP: (!) 172/78 (!) 171/73  Pulse: (!) 51 (!) 57  Resp: 11 13  Temp:  36.5 C  SpO2: 99% 99%    Last Pain:  Vitals:   04/18/18 1520  TempSrc:   PainSc: 0-No pain                 Martha Clan

## 2018-04-18 NOTE — Anesthesia Procedure Notes (Signed)
Procedure Name: LMA Insertion Date/Time: 04/18/2018 1:37 PM Performed by: Dionne Bucy, CRNA Pre-anesthesia Checklist: Patient identified, Patient being monitored, Timeout performed, Emergency Drugs available and Suction available Patient Re-evaluated:Patient Re-evaluated prior to induction Oxygen Delivery Method: Circle system utilized Preoxygenation: Pre-oxygenation with 100% oxygen Induction Type: IV induction Ventilation: Mask ventilation without difficulty LMA: LMA inserted LMA Size: 4.0 Tube type: Oral Number of attempts: 1 Placement Confirmation: positive ETCO2 and breath sounds checked- equal and bilateral Tube secured with: Tape Dental Injury: Teeth and Oropharynx as per pre-operative assessment

## 2018-04-18 NOTE — Anesthesia Post-op Follow-up Note (Signed)
Anesthesia QCDR form completed.        

## 2018-04-18 NOTE — Anesthesia Preprocedure Evaluation (Signed)
Anesthesia Evaluation  Patient identified by MRN, date of birth, ID band Patient awake    Reviewed: Allergy & Precautions, H&P , NPO status , Patient's Chart, lab work & pertinent test results, reviewed documented beta blocker date and time   History of Anesthesia Complications Negative for: history of anesthetic complications  Airway Mallampati: I  TM Distance: >3 FB Neck ROM: full    Dental  (+) Edentulous Upper, Edentulous Lower, Upper Dentures, Lower Dentures   Pulmonary neg pulmonary ROS, former smoker,           Cardiovascular Exercise Tolerance: Good hypertension, (-) angina+ CAD, + Past MI and + Cardiac Stents  (-) CABG (-) dysrhythmias (-) Valvular Problems/Murmurs     Neuro/Psych negative neurological ROS  negative psych ROS   GI/Hepatic Neg liver ROS, GERD  ,  Endo/Other  negative endocrine ROS  Renal/GU negative Renal ROS  negative genitourinary   Musculoskeletal  (+) Arthritis ,   Abdominal   Peds  Hematology negative hematology ROS (+)   Anesthesia Other Findings Past Medical History: No date: Anemia No date: Arthritis No date: Coronary artery disease No date: GERD (gastroesophageal reflux disease) No date: High cholesterol No date: Hypertension 01/2014: MI (myocardial infarction)   Reproductive/Obstetrics negative OB ROS                             Anesthesia Physical  Anesthesia Plan  ASA: II  Anesthesia Plan: General   Post-op Pain Management:    Induction: Intravenous  PONV Risk Score and Plan: Ondansetron, Dexamethasone and Treatment may vary due to age or medical condition  Airway Management Planned: LMA  Additional Equipment:   Intra-op Plan:   Post-operative Plan: Extubation in OR  Informed Consent: I have reviewed the patients History and Physical, chart, labs and discussed the procedure including the risks, benefits and alternatives for the  proposed anesthesia with the patient or authorized representative who has indicated his/her understanding and acceptance.     Dental Advisory Given  Plan Discussed with: Anesthesiologist, CRNA and Surgeon  Anesthesia Plan Comments:         Anesthesia Quick Evaluation

## 2018-04-20 LAB — SURGICAL PATHOLOGY

## 2018-04-26 ENCOUNTER — Ambulatory Visit (INDEPENDENT_AMBULATORY_CARE_PROVIDER_SITE_OTHER): Payer: Medicare Other | Admitting: Urology

## 2018-04-26 ENCOUNTER — Encounter: Payer: Self-pay | Admitting: Urology

## 2018-04-26 VITALS — BP 152/81 | HR 81 | Ht 68.0 in | Wt 205.0 lb

## 2018-04-26 DIAGNOSIS — C679 Malignant neoplasm of bladder, unspecified: Secondary | ICD-10-CM

## 2018-04-26 NOTE — Patient Instructions (Signed)
Bladder Cancer  Bladder cancer is an abnormal growth of tissue in the bladder. The bladder is the balloon-like sac in the pelvis. It collects and stores urine that comes from the kidneys through the ureters. The bladder wall is made of layers. If cancer spreads into these layers and through the wall of the bladder, it becomes more difficult to treat. What are the causes? The cause of this condition is not known. What increases the risk? The following factors may make you more likely to develop this condition:  Smoking.  Workplace risks (occupational exposures), such as rubber, leather, textile, dyes, chemicals, and paint.  Being white.  Your age. Most people with bladder cancer are over the age of 55.  Being male.  Having chronic bladder inflammation.  Having a personal history of bladder cancer.  Having a family history of bladder cancer (heredity).  Having had chemotherapy or radiation therapy to the pelvis.  Having been exposed to arsenic. What are the signs or symptoms? Initial symptoms of this condition include:  Blood in the urine.  Painful urination.  Frequent bladder or urine infections.  Increase in urgency and frequency of urination. Advanced symptoms of this condition include:  Not being able to urinate.  Low back pain on one side.  Loss of appetite.  Weight loss.  Fatigue.  Swelling in the feet.  Bone pain. How is this diagnosed? This condition is diagnosed based on your medical history, a physical exam, urine tests, lab tests, imaging tests, and your symptoms. You may also have other tests or procedures done, such as:  A narrow tube being inserted into your bladder through your urethra (cystoscopy) in order to view the lining of your bladder for tumors.  A biopsy to sample the tumor to see if cancer is present. If cancer is present, it will then be staged to determine its severity and extent. Staging is an assessment of:  The size of the  tumor.  Whether the cancer has spread.  Where the cancer has spread. It is important to know how deeply into the bladder wall cancer has grown and whether cancer has spread to any other parts of your body. Staging may require blood tests or imaging tests, such as a CT scan, MRI, bone scan, or chest X-ray. How is this treated? Based on the stage of cancer, one treatment or a combination of treatments may be recommended. The most common forms of treatment are:  Surgery to remove the cancer. Procedures that may be done include transurethral resection and cystectomy.  Radiation therapy. This is high-energy X-rays or other particles. This is often used in combination with chemotherapy.  Chemotherapy. During this treatment, medicines are used to kill cancer cells.  Immunotherapy. This uses medicines to help your own immune system destroy cancer cells. Follow these instructions at home:  Take over-the-counter and prescription medicines only as told by your health care provider.  Maintain a healthy diet. Some of your treatments might affect your appetite.  Consider joining a support group. This may help you learn to cope with the stress of having bladder cancer.  Tell your cancer care team if you develop side effects. They may be able to recommend ways to relieve them.  Keep all follow-up visits as told by your health care provider. This is important. Where to find more information  American Cancer Society: www.cancer.org  National Cancer Institute (NCI): www.cancer.gov Contact a health care provider if:  You have symptoms of a urinary tract infection. These include: ?   Fever. ? Chills. ? Weakness. ? Muscle aches. ? Abdominal pain. ? Frequent and intense urge to urinate. ? Burning feeling in the bladder or urethra during urination. Get help right away if:  There is blood in your urine.  You cannot urinate.  You have severe pain or other symptoms that do not go  away. Summary  Bladder cancer is an abnormal growth of tissue in the bladder.  This condition is diagnosed based on your medical history, a physical exam, urine tests, lab tests, imaging tests, and your symptoms.  Based on the stage of cancer, surgery, chemotherapy, or a combination of treatments may be recommended.  Consider joining a support group. This may help you learn to cope with the stress of having bladder cancer. This information is not intended to replace advice given to you by your health care provider. Make sure you discuss any questions you have with your health care provider. Document Released: 02/24/2003 Document Revised: 01/26/2016 Document Reviewed: 01/26/2016 Elsevier Interactive Patient Education  2019 Elsevier Inc.  

## 2018-04-26 NOTE — Progress Notes (Signed)
   04/26/2018 9:51 AM   Cannon Kettle Jr. 01-15-1946 [id number]  Reason for visit: Discuss bladder biopsy results  HPI: I saw Mr. Cody Fritz back in urology clinic to discuss his bladder biopsy results.  To briefly summarize, he is a 73 year old male with an 80-pack-year smoking history(has quit smoking) history of CAD with stent placement on aspirin and Plavix, who presented with microscopic hematuria in January 2020.  CT urogram did not show any abnormalities in the upper tract, however clinic cystoscopy showed a 5 mm papillary bladder tumor.  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  With muscle was present and not involved.  We had a long discussion about his pathology results and new diagnosis of non-muscle invasive bladder cancer.  Fortunately, he had a very small 5 mm tumor on a narrow stalk, and this tumor was completely removed.  We discussed possible options including induction BCG, however with his very small tumor and the BCG shortage, I do not feel this would be of significant benefit to him.  We discussed the high recurrence rate and need for close surveillance at length.  RTC 3 months for clinic cystoscopy  A total of 15 minutes were spent face-to-face with the patient, greater than 50% was spent in patient education, counseling, and coordination of care regarding new diagnosis of non-muscle invasive bladder cancer.   Billey Co, [facility] [address] [place], [place] [address] [phone]

## 2018-05-08 DIAGNOSIS — I70219 Atherosclerosis of native arteries of extremities with intermittent claudication, unspecified extremity: Secondary | ICD-10-CM | POA: Insufficient documentation

## 2018-07-25 ENCOUNTER — Other Ambulatory Visit: Payer: Medicare Other | Admitting: Urology

## 2018-08-20 ENCOUNTER — Ambulatory Visit: Payer: Medicare Other | Admitting: Urology

## 2018-08-20 ENCOUNTER — Encounter: Payer: Self-pay | Admitting: Urology

## 2018-08-20 ENCOUNTER — Other Ambulatory Visit: Payer: Self-pay

## 2018-08-20 VITALS — BP 139/76 | HR 71 | Ht 68.0 in | Wt 208.0 lb

## 2018-08-20 DIAGNOSIS — C679 Malignant neoplasm of bladder, unspecified: Secondary | ICD-10-CM

## 2018-08-20 NOTE — Progress Notes (Signed)
Bladder cancer surveillance note  INDICATION Hx of HG Ta urothelial cell cancer  UROLOGIC HISTORY Cody Fritz. is a 73 y.o. male that was found to have a 5 mm papillary bladder tumor on microscopic hematuria work-up, and underwent cold cup biopsy removal and fulguration on 04/18/2018.  Initial Diagnosis of Bladder  Year: 04/2018   Pathology: HG Ta, 33mm papillary lesion  Recurrent Bladder Cancer Diagnosis No history of recurrence  Treatments for Bladder Cancer 04/18/2018: Cold cup biopsy removal and fulguration (Did not receive BCG secondary to COVID-19/BCG shortage/very small lesion)  AUA Risk Category Intermediate  Cystoscopy Procedure Note:  After informed consent and discussion of the procedure and its risks, [name] was positioned and prepped in the standard fashion. Cystoscopy was performed with the a flexible cystoscope. The urethra, bladder neck and entire bladder was visualized in a standard fashion, and the mucosa was grossly normal throughout. The ureteral orifices were visualized in their normal location and orientation.  No evidence of disease recurrence.  Follow-up: 6 months for cystoscopy Consider repeat CT urogram in spring 2022  [name], MD 08/20/2018

## 2018-08-21 LAB — MICROSCOPIC EXAMINATION: Bacteria, UA: NONE SEEN

## 2018-08-21 LAB — URINALYSIS, COMPLETE
Bilirubin, UA: NEGATIVE
Glucose, UA: NEGATIVE
Ketones, UA: NEGATIVE
Leukocytes,UA: NEGATIVE
Nitrite, UA: NEGATIVE
Protein,UA: NEGATIVE
Specific Gravity, UA: 1.01 (ref 1.005–1.030)
Urobilinogen, Ur: 0.2 mg/dL (ref 0.2–1.0)
pH, UA: 6 (ref 5.0–7.5)

## 2018-11-22 ENCOUNTER — Other Ambulatory Visit: Payer: Self-pay | Admitting: Physician Assistant

## 2018-11-22 DIAGNOSIS — M7989 Other specified soft tissue disorders: Secondary | ICD-10-CM

## 2018-11-26 ENCOUNTER — Other Ambulatory Visit: Payer: Self-pay

## 2018-11-26 ENCOUNTER — Ambulatory Visit
Admission: RE | Admit: 2018-11-26 | Discharge: 2018-11-26 | Disposition: A | Discharge status: Home / Self Care | Payer: Medicare Other | Source: Ambulatory Visit | Attending: Physician Assistant | Admitting: Physician Assistant

## 2018-11-26 ENCOUNTER — Encounter (INDEPENDENT_AMBULATORY_CARE_PROVIDER_SITE_OTHER): Payer: Self-pay

## 2018-11-26 DIAGNOSIS — M7989 Other specified soft tissue disorders: Secondary | ICD-10-CM | POA: Diagnosis present

## 2019-02-08 DIAGNOSIS — R600 Localized edema: Secondary | ICD-10-CM | POA: Insufficient documentation

## 2019-02-08 DIAGNOSIS — Z6832 Body mass index (BMI) 32.0-32.9, adult: Secondary | ICD-10-CM | POA: Insufficient documentation

## 2019-02-13 ENCOUNTER — Ambulatory Visit: Payer: Medicare Other | Admitting: Urology

## 2019-02-13 ENCOUNTER — Other Ambulatory Visit: Payer: Self-pay

## 2019-02-13 VITALS — BP 153/79 | HR 79 | Ht 68.0 in | Wt 208.0 lb

## 2019-02-13 DIAGNOSIS — C679 Malignant neoplasm of bladder, unspecified: Secondary | ICD-10-CM | POA: Diagnosis not present

## 2019-02-13 NOTE — Progress Notes (Signed)
Bladder cancer surveillance note  INDICATION Hx of HG Ta urothelial cell cancer  UROLOGIC HISTORY Cody Fritz. is a 73 y.o. male that was found to have a 79mm papillary bladder tumor on microscopic hematuria work-up, and underwent cold cup biopsy removal and fulguration on 04/18/2018 with post-op gemcitabine.  Initial Diagnosis of Bladder Year: 04/2018  Pathology: HG Ta, 51mm papillary lesion  Recurrent Bladder Cancer Diagnosis No history of recurrence  Treatments for Bladder Cancer 04/18/2018: Cold cup biopsy removal and fulguration, post-op gemcitabine (Did not receive BCG secondary to COVID-19/BCG shortage/very small lesion)  AUA Risk Category Intermediate  Cystoscopy Procedure Note:  After informed consent and discussion of the procedure and its risks, [name]. was positioned and prepped in the standard fashion. Cystoscopy was performed with the a flexible cystoscope. The urethra, bladder neck and entire bladder was visualized in a standard fashion, and the mucosa was grossly normal throughout. The ureteral orifices were visualized in their normal location and orientation. No abnormalities on retroflexion. No evidence of disease recurrence.  Follow-up: 6 months for cystoscopy Consider repeat CT urogram in spring 2022  [name], MD 02/13/2019

## 2019-02-14 LAB — MICROSCOPIC EXAMINATION: Bacteria, UA: NONE SEEN

## 2019-02-14 LAB — URINALYSIS, COMPLETE
Bilirubin, UA: NEGATIVE
Glucose, UA: NEGATIVE
Ketones, UA: NEGATIVE
Leukocytes,UA: NEGATIVE
Nitrite, UA: NEGATIVE
Protein,UA: NEGATIVE
Specific Gravity, UA: 1.025 (ref 1.005–1.030)
Urobilinogen, Ur: 0.2 mg/dL (ref 0.2–1.0)
pH, UA: 5.5 (ref 5.0–7.5)

## 2019-02-20 ENCOUNTER — Other Ambulatory Visit: Payer: Medicare Other | Admitting: Urology

## 2019-08-09 DIAGNOSIS — C679 Malignant neoplasm of bladder, unspecified: Secondary | ICD-10-CM

## 2019-08-09 HISTORY — DX: Malignant neoplasm of bladder, unspecified: C67.9

## 2019-08-15 ENCOUNTER — Other Ambulatory Visit: Payer: Self-pay

## 2019-08-15 ENCOUNTER — Encounter: Payer: Self-pay | Admitting: Urology

## 2019-08-15 ENCOUNTER — Ambulatory Visit: Payer: Medicare Other | Admitting: Urology

## 2019-08-15 VITALS — BP 131/76 | HR 71 | Ht 68.0 in | Wt 210.0 lb

## 2019-08-15 DIAGNOSIS — C679 Malignant neoplasm of bladder, unspecified: Secondary | ICD-10-CM | POA: Diagnosis not present

## 2019-08-15 LAB — URINALYSIS, COMPLETE
Bilirubin, UA: NEGATIVE
Glucose, UA: NEGATIVE
Ketones, UA: NEGATIVE
Leukocytes,UA: NEGATIVE
Nitrite, UA: NEGATIVE
Protein,UA: NEGATIVE
Specific Gravity, UA: 1.025 (ref 1.005–1.030)
Urobilinogen, Ur: 0.2 mg/dL (ref 0.2–1.0)
pH, UA: 5 (ref 5.0–7.5)

## 2019-08-15 LAB — MICROSCOPIC EXAMINATION: Bacteria, UA: NONE SEEN

## 2019-08-15 MED ORDER — LIDOCAINE HCL URETHRAL/MUCOSAL 2 % EX GEL
1.0000 "application " | Freq: Once | CUTANEOUS | Status: AC
  Administered 2019-08-15: 1 via URETHRAL

## 2019-08-15 NOTE — Progress Notes (Signed)
Bladder cancer surveillance note  INDICATION Hx of HG Ta urothelial cell cancer  UROLOGIC HISTORY Cody Fritzis a 74 y.o.malethatwas found to have a 31mm papillary bladder tumor on microscopic hematuria work-up, and underwent cold cup biopsy removal and fulguration on 04/18/2018 with post-op gemcitabine.  Initial Diagnosis of Bladder Year:04/2018 Pathology:HG Ta, 72mm papillary lesion  Recurrent Bladder Cancer Diagnosis No history of recurrence  Treatments for Bladder Cancer 04/18/2018:Cold cup biopsy removal and fulguration, post-op gemcitabine (Did not receive BCG secondary to COVID-19/BCG shortage/very small lesion)  AUA Risk Category Intermediate  Cystoscopy Procedure Note:  After informed consent and discussion of the procedure and its risks,Cody Fritzwas positioned and prepped in the standard fashion. Cystoscopy was performed with the a flexible cystoscope. The urethra, bladder neck and entire bladder was visualized in a standard fashion, and the mucosa was grossly normal throughout. The ureteral orifices were visualized in their normal location and orientation.No abnormalities on retroflexion.No evidence of disease recurrence.  Follow-up: 6 months for cystoscopy, consider spacing to annual cysto at that visit Repeat CT urogram prior to next cysto  [name], MD 08/15/2019

## 2019-09-02 IMAGING — CT CT ABD-PEL WO/W CM
3 of 12 series · 11 of 46 positions shown, 17 images · IV contrast (iopamidol)
Comparison: None.

Addendum:
CLINICAL DATA: Microscopic hematuria

EXAM:
CT ABDOMEN AND PELVIS WITHOUT AND WITH CONTRAST
TECHNIQUE: Multidetector CT imaging of the abdomen and pelvis was performed
following the standard protocol before and following the bolus
administration of intravenous contrast.
CONTRAST:  125mL FEN2EX-IRR IOPAMIDOL (FEN2EX-IRR) INJECTION 61%

[Series 2: without pre · axial · non-contrast · 0.79mm/px · z∈[-1492,-1427]mm · 2 of 90 slices shown]
[im 13/90  soft-tissue]
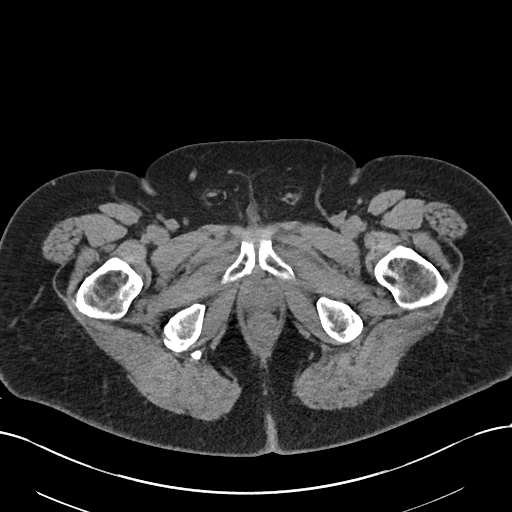
[im 26/90  soft-tissue]
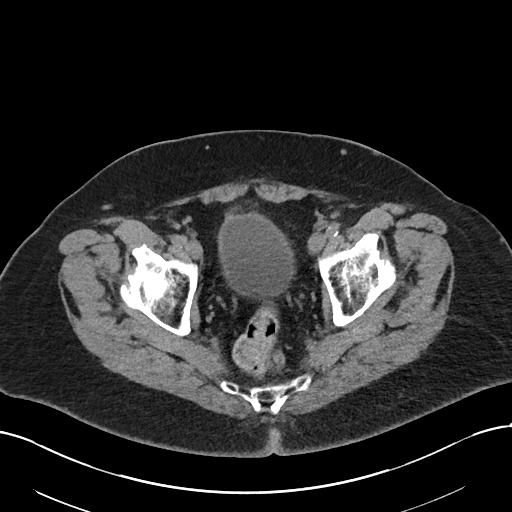

[Series 5: cor without without pre · coronal · non-contrast · 0.79mm/px · 2 of 155 slices shown, 3 images]
[im 52/155  soft-tissue]
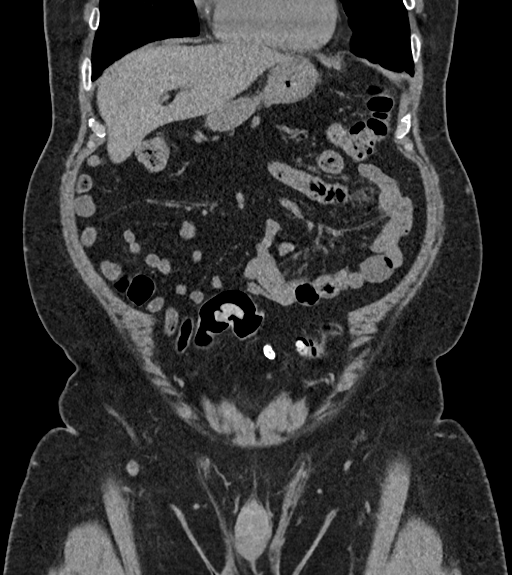
[im 52/155  bone]
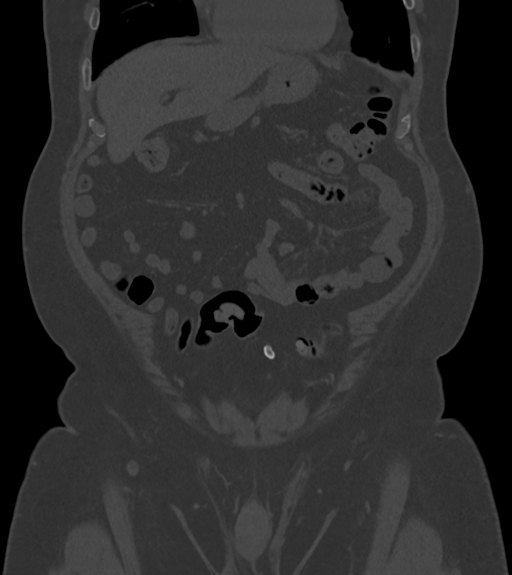
[im 103/155  soft-tissue]
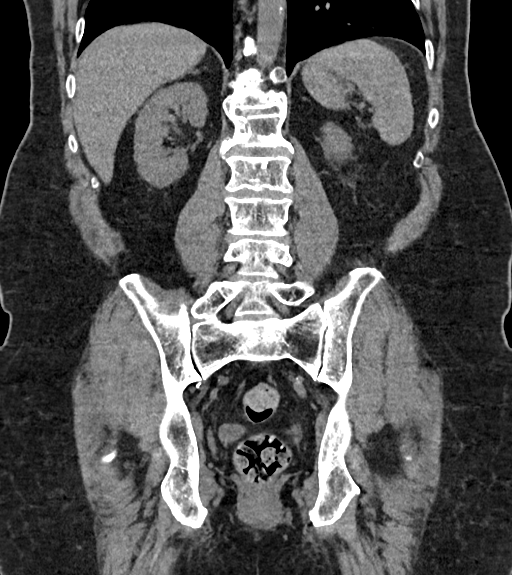

[Series 17: axial delay delay prone · axial · delayed · 0.75mm/px · z∈[-1546,-1191]mm · 7 of 95 slices shown, 12 images]
[im 12/95  soft-tissue]
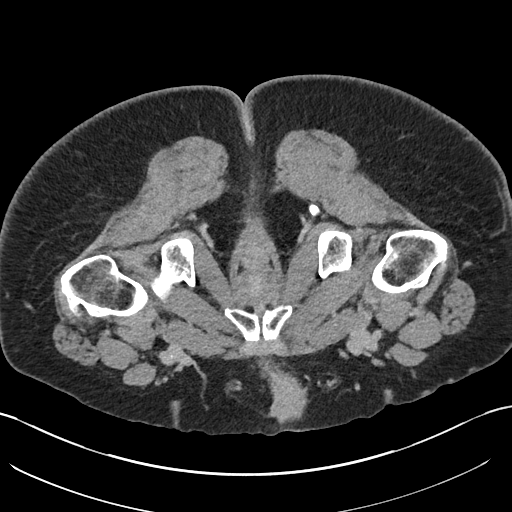
[im 12/95  bone]
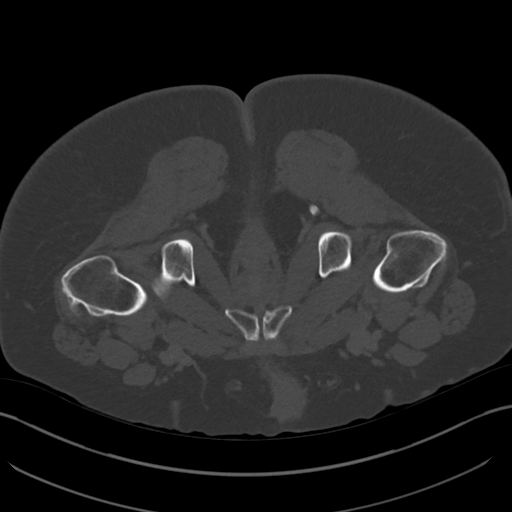
[im 24/95  soft-tissue]
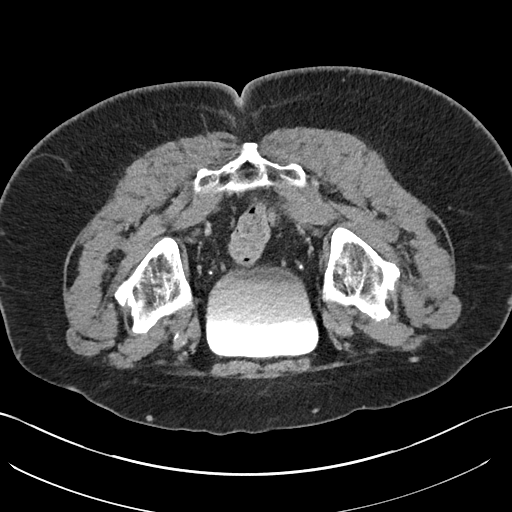
[im 36/95  soft-tissue]
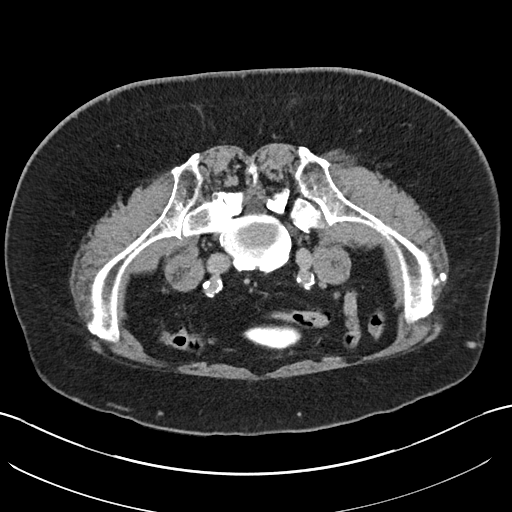
[im 48/95  soft-tissue]
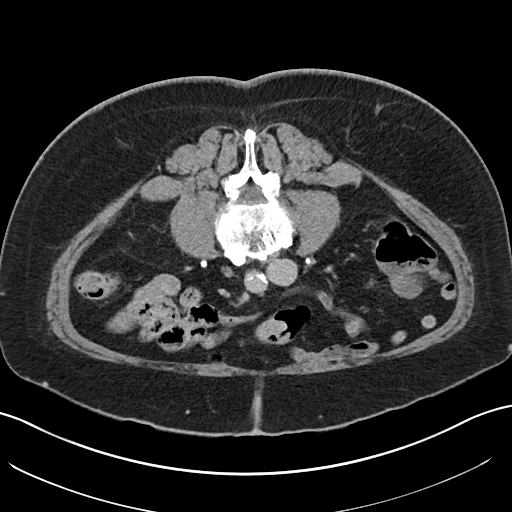
[im 48/95  lung]
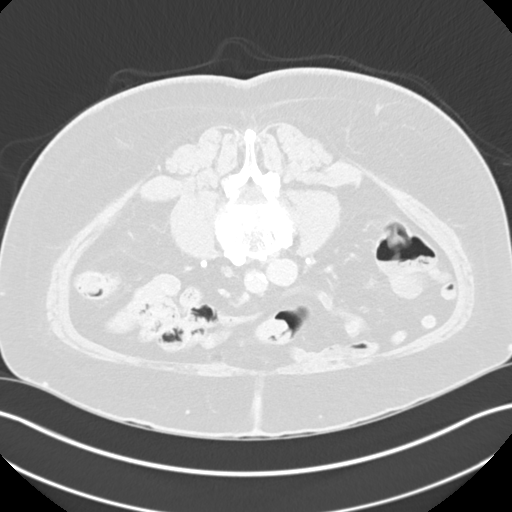
[im 59/95  soft-tissue]
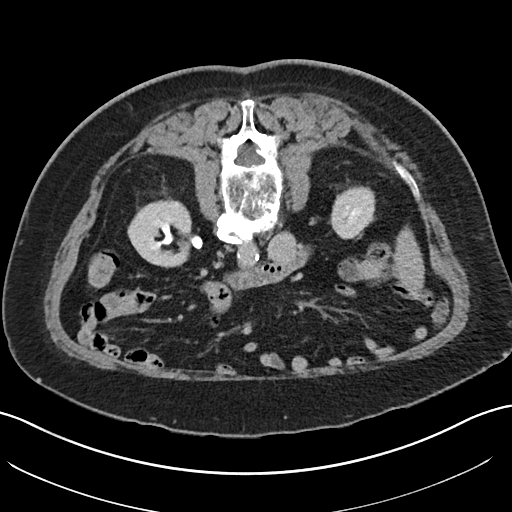
[im 59/95  lung]
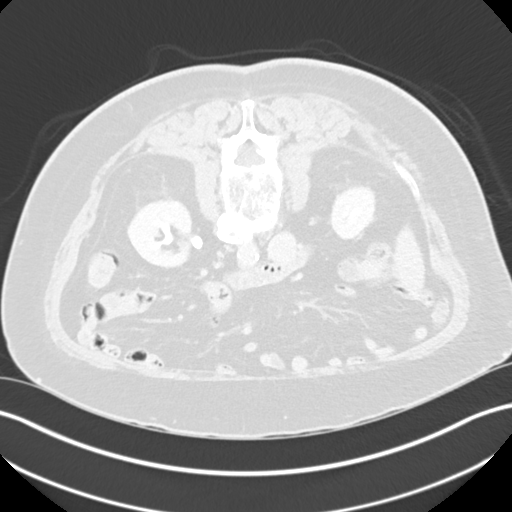
[im 71/95  soft-tissue]
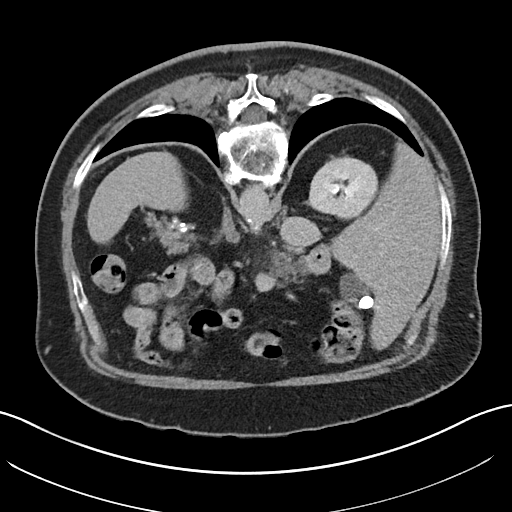
[im 71/95  lung]
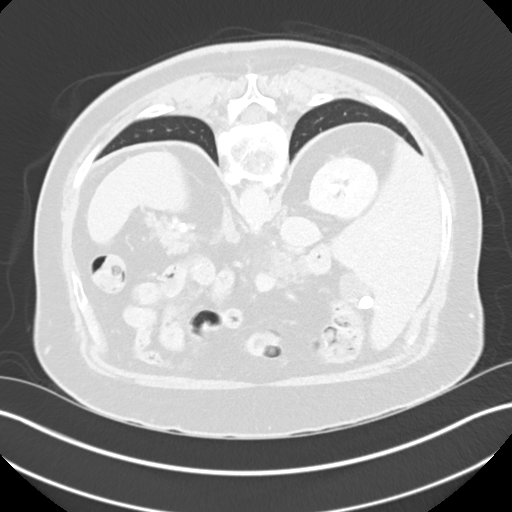
[im 83/95  soft-tissue]
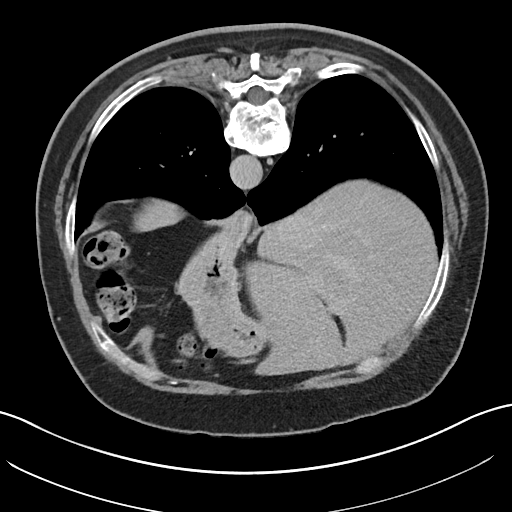
[im 83/95  lung]
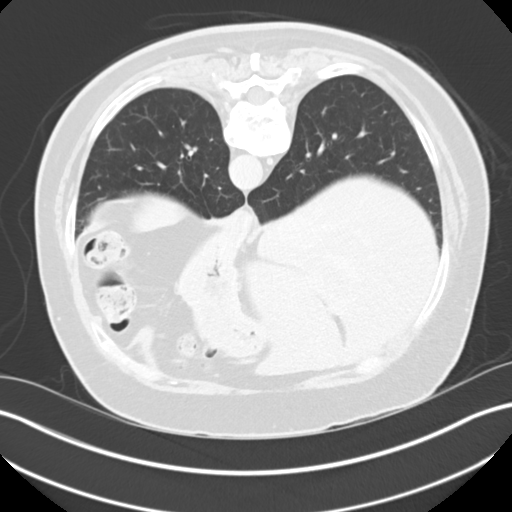

[11 of 46 positions shown; findings below may reference images not displayed]

FINDINGS: Lower chest: Coronary stent, descending thoracic aortic
atherosclerotic calcification.

Hepatobiliary: Multiple gallstones measuring up to 1.3 cm in long
axis. No gallbladder wall thickening or pericholecystic fluid. No
biliary dilatation. No significant focal liver lesions.

Pancreas: Unremarkable

Spleen: Unremarkable

Adrenals/Urinary Tract: Adrenal glands normal. 1.7 cm Bosniak
category 1 cyst of the left kidney lower pole. A tiny hypodense
lesion of the left kidney upper pole and 3 small hypodense lesions
of the right kidney are technically too small to characterize,
although statistically likely to be benign cysts.

There is a vascular calcification at the right renal hilum. No
appreciable urinary tract calculi. No significant abnormal renal
parenchymal enhancement. No significant filling defect is identified
along the urothelium.

Stomach/Bowel: Sigmoid colon diverticula noted.  Normal appendix.

Vascular/Lymphatic: Aortoiliac atherosclerotic vascular disease. No
pathologic adenopathy observed.

Reproductive: Unremarkable

Other: No supplemental non-categorized findings.

Musculoskeletal: Lower thoracic and lumbar spondylosis. Mild
degenerative disc disease. Borderline left foraminal impingement at
L4-5.
IMPRESSION: 1. No cause for hematuria is identified.
2. Cholelithiasis.
3. Simple cyst of the left kidney lower pole. Three additional tiny
hypodense lesions in the kidneys are statistically likely to be
cysts but technically nonspecific.
4.  Aortic Atherosclerosis ([id number]).

ADDENDUM:
The original report was by Dr. Pito Dedrick. The following
addendum is by Dr. Pito Dedrick:

I have been in touch with Dr. Ceejay with [facility] by
telephone and we discussed this case on 04/05/2018. In particular we
focused on the trilaminar settling of contrast in the urinary
bladder on the delayed images, and the slight anterior convexity of
the right anterior inferior portion of the middle layer shown on
image 77/17. At original interpretation I was not able to
corroborate this disruption of the layering with any portal venous
phase enhancing lesion and accordingly I felt at the time this was
likely to represent a ureteral jet of contrast causing some
disruption of the interface between the layering contrast medium,
which we sometimes encounter when the excreted bladder contrast
layers out in this fashion. Even in retrospect I do not perceive an
enhancing mass along the bladder wall in the corresponding region
for example on portal venous phase images. However, I do think that
there is a chance particularly in light of the patient's persistent
microhematuria without other explanation that this could represent
an unusually hypodense or hypoenhancing urothelial tumor, and that
cystoscopy is a highly reasonable and appropriate course to
determine whether or not there is tumor in this location.

*** End of Addendum ***

## 2020-02-19 ENCOUNTER — Other Ambulatory Visit: Payer: Self-pay | Admitting: Urology

## 2020-05-14 IMAGING — US US EXTREM LOW VENOUS
1 series · 13 of 24 positions shown · non-contrast
Comparison: None.

CLINICAL DATA: Lower extremity edema.



[Series 1: us extrem low venous · 0.08mm/px · 13 of 60 slices shown]
[im 1/60]
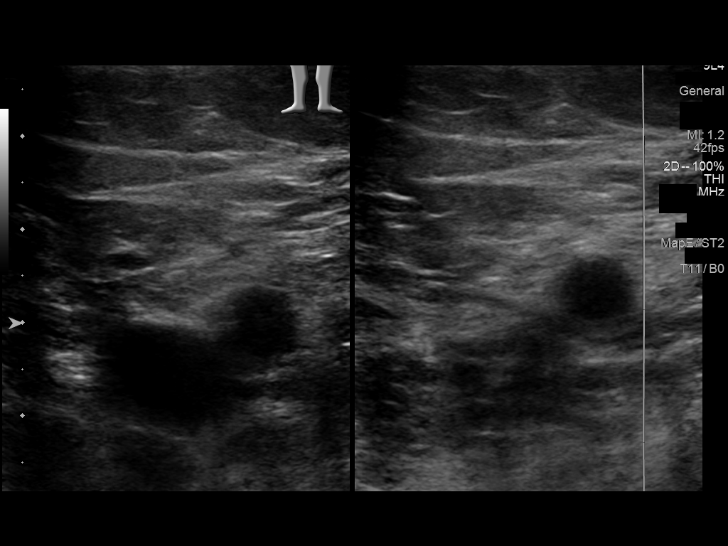
[im 6/60]
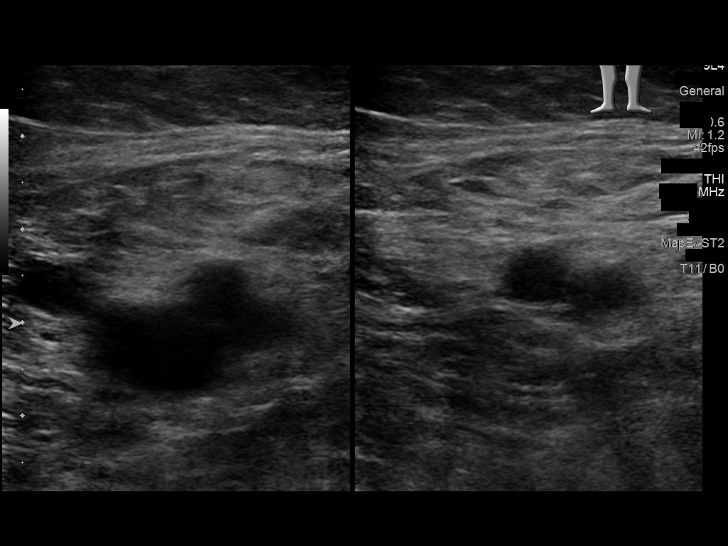
[im 11/60]
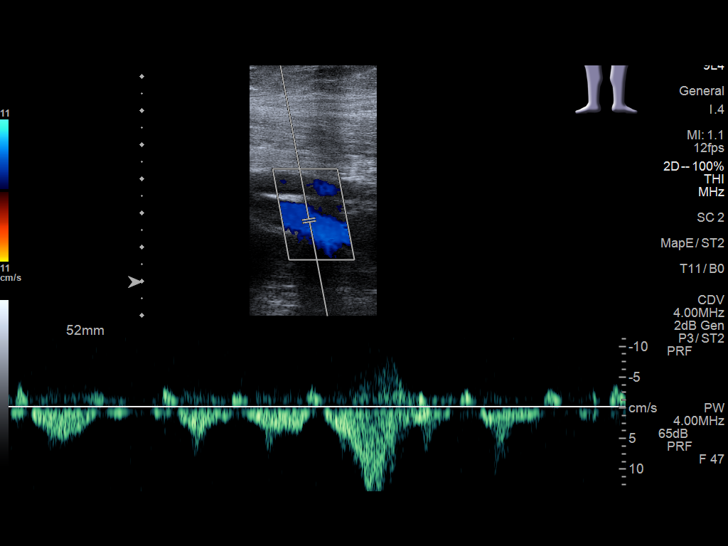
[im 16/60]
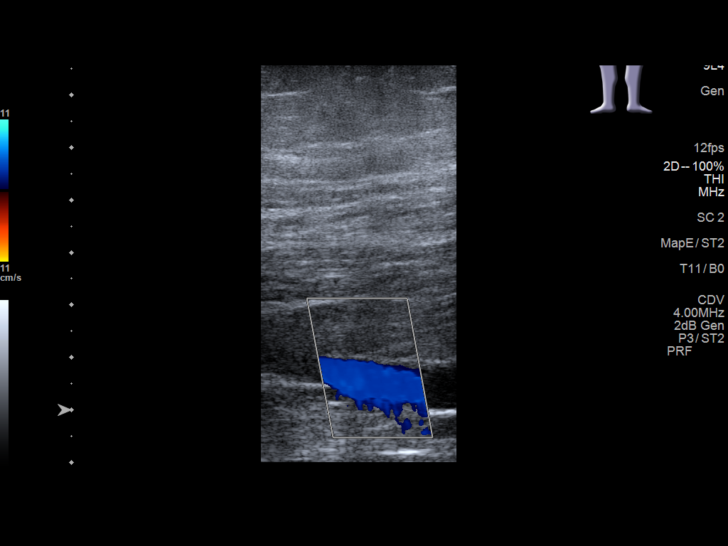
[im 21/60]
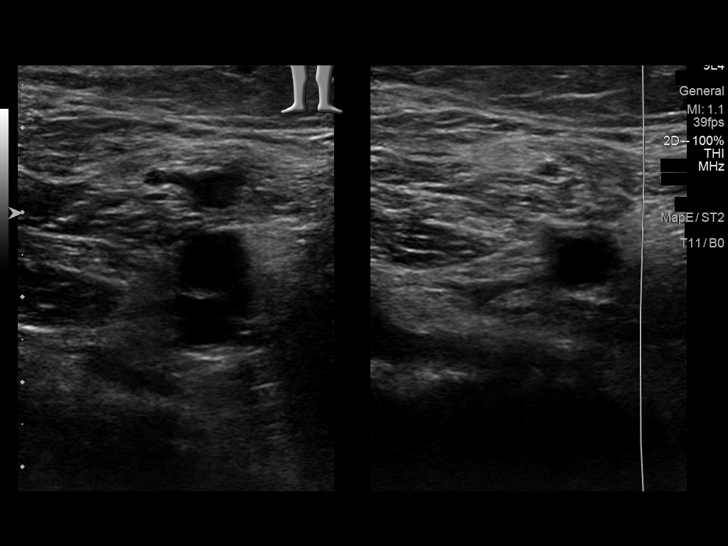
[im 26/60]
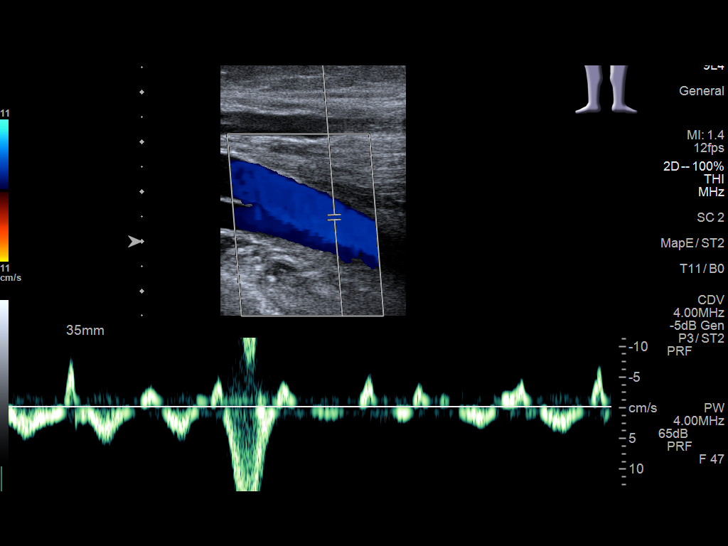
[im 31/60]
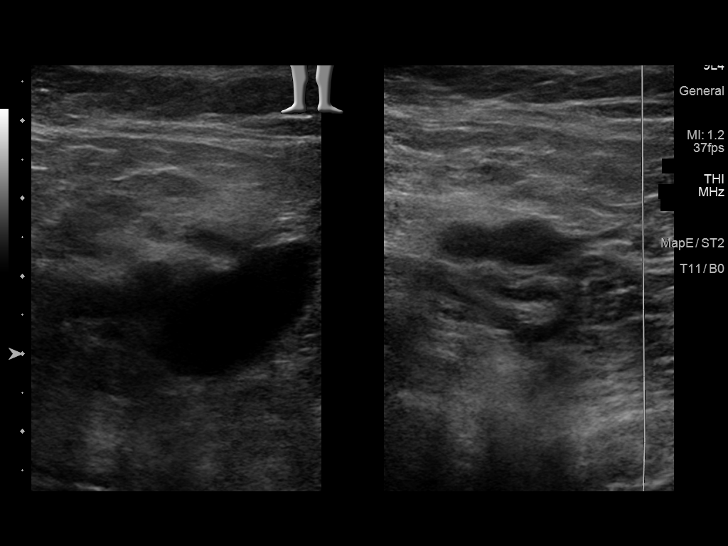
[im 34/60]
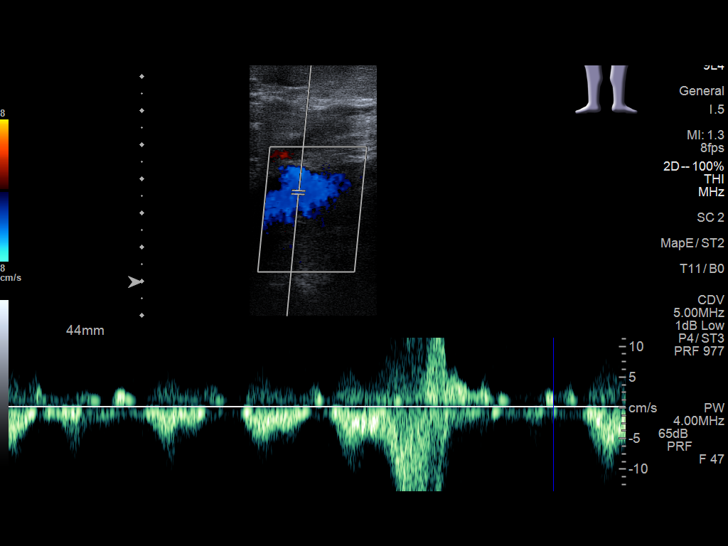
[im 39/60]
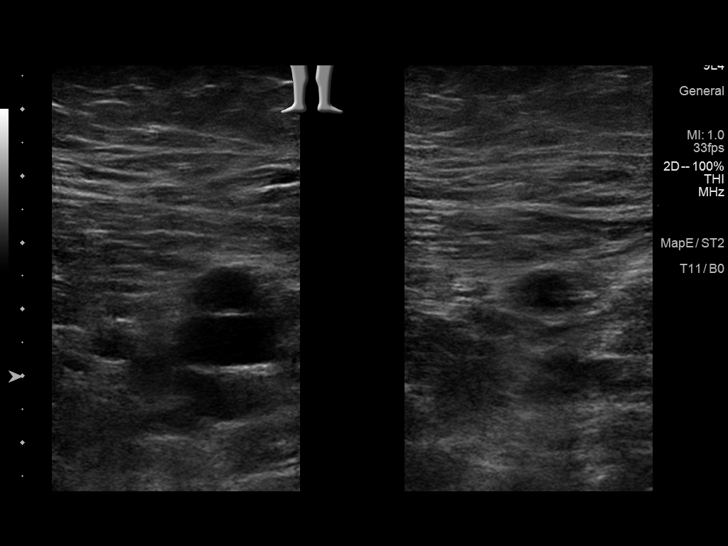
[im 44/60]
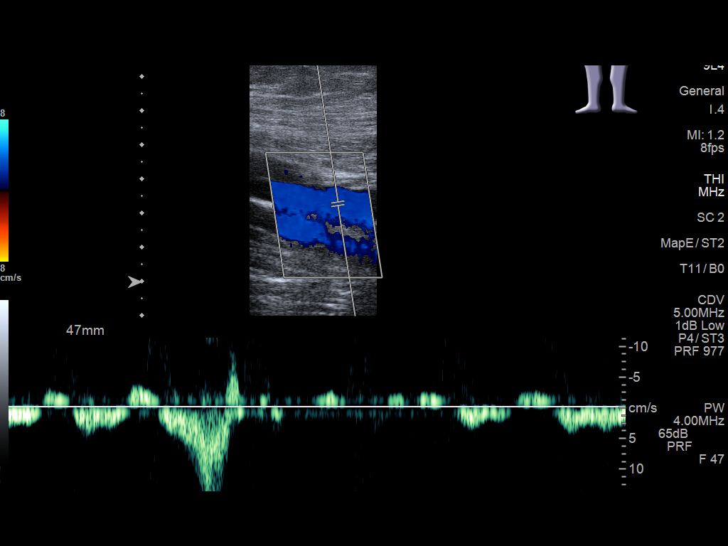
[im 49/60]
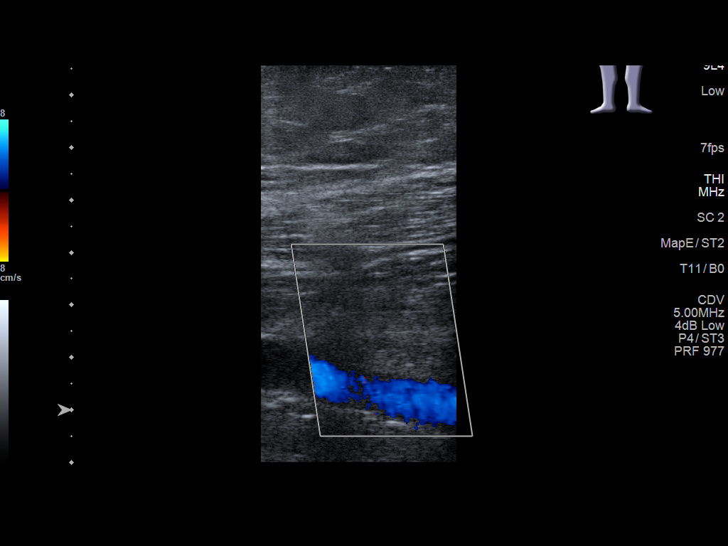
[im 54/60]
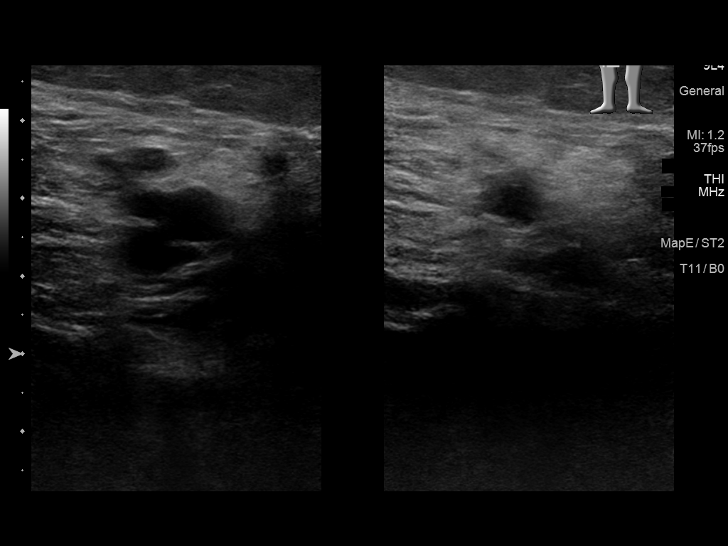
[im 60/60]
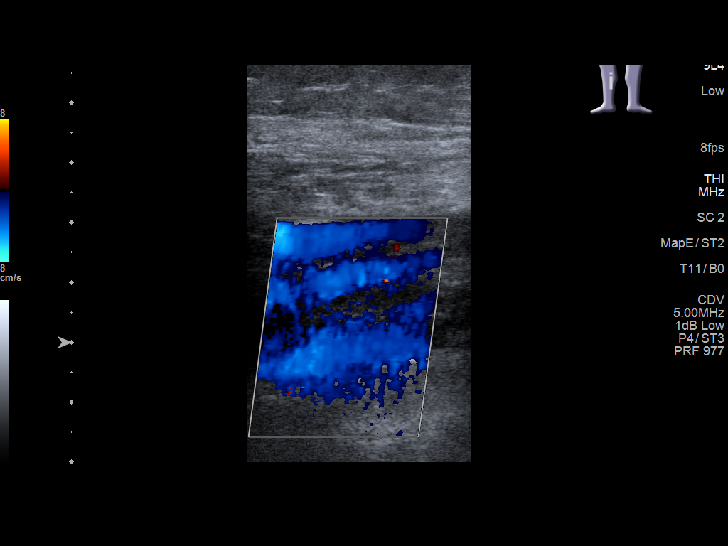

[13 of 24 positions shown; findings below may reference images not displayed]

FINDINGS: RIGHT LOWER EXTREMITY

Common Femoral Vein: No evidence of thrombus. Normal
compressibility, respiratory phasicity and response to augmentation.

Saphenofemoral Junction: No evidence of thrombus. Normal
compressibility and flow on color Doppler imaging.

Profunda Femoral Vein: No evidence of thrombus. Normal
compressibility and flow on color Doppler imaging.

Femoral Vein: No evidence of thrombus. Normal compressibility,
respiratory phasicity and response to augmentation.

Popliteal Vein: No evidence of thrombus. Normal compressibility,
respiratory phasicity and response to augmentation.

Calf Veins: No evidence of thrombus. Normal compressibility and flow
on color Doppler imaging.

Superficial Great Saphenous Vein: No evidence of thrombus. Normal
compressibility.

Venous Reflux:  None.

Other Findings: No evidence of superficial thrombophlebitis or
abnormal fluid collection.

LEFT LOWER EXTREMITY

Common Femoral Vein: No evidence of thrombus. Normal
compressibility, respiratory phasicity and response to augmentation.

Saphenofemoral Junction: No evidence of thrombus. Normal
compressibility and flow on color Doppler imaging.

Profunda Femoral Vein: No evidence of thrombus. Normal
compressibility and flow on color Doppler imaging.

Femoral Vein: No evidence of thrombus. Normal compressibility,
respiratory phasicity and response to augmentation.

Popliteal Vein: No evidence of thrombus. Normal compressibility,
respiratory phasicity and response to augmentation.

Calf Veins: No evidence of thrombus. Normal compressibility and flow
on color Doppler imaging.

Superficial Great Saphenous Vein: No evidence of thrombus. Normal
compressibility.

Venous Reflux:  None.

Other Findings: No evidence of superficial thrombophlebitis or
abnormal fluid collection.
IMPRESSION: No evidence of deep venous thrombosis in either lower extremity.

## 2020-06-26 ENCOUNTER — Telehealth (INDEPENDENT_AMBULATORY_CARE_PROVIDER_SITE_OTHER): Payer: Self-pay

## 2020-06-29 NOTE — Telephone Encounter (Signed)
For documentation

## 2020-09-22 ENCOUNTER — Other Ambulatory Visit (INDEPENDENT_AMBULATORY_CARE_PROVIDER_SITE_OTHER): Payer: Self-pay | Admitting: Vascular Surgery

## 2020-09-22 DIAGNOSIS — I739 Peripheral vascular disease, unspecified: Secondary | ICD-10-CM

## 2020-09-25 ENCOUNTER — Encounter (INDEPENDENT_AMBULATORY_CARE_PROVIDER_SITE_OTHER): Payer: Self-pay | Admitting: Vascular Surgery

## 2020-09-25 ENCOUNTER — Other Ambulatory Visit: Payer: Self-pay

## 2020-09-25 ENCOUNTER — Ambulatory Visit (INDEPENDENT_AMBULATORY_CARE_PROVIDER_SITE_OTHER): Payer: Medicare Other

## 2020-09-25 ENCOUNTER — Ambulatory Visit (INDEPENDENT_AMBULATORY_CARE_PROVIDER_SITE_OTHER): Payer: Medicare Other | Admitting: Vascular Surgery

## 2020-09-25 VITALS — BP 126/71 | HR 75 | Resp 16 | Ht 69.0 in | Wt 216.2 lb

## 2020-09-25 DIAGNOSIS — I10 Essential (primary) hypertension: Secondary | ICD-10-CM | POA: Diagnosis not present

## 2020-09-25 DIAGNOSIS — I739 Peripheral vascular disease, unspecified: Secondary | ICD-10-CM

## 2020-09-25 DIAGNOSIS — E782 Mixed hyperlipidemia: Secondary | ICD-10-CM | POA: Diagnosis not present

## 2020-09-25 DIAGNOSIS — I2510 Atherosclerotic heart disease of native coronary artery without angina pectoris: Secondary | ICD-10-CM

## 2020-09-25 NOTE — Assessment & Plan Note (Signed)
blood pressure control important in reducing the progression of atherosclerotic disease. On appropriate oral medications.  

## 2020-09-25 NOTE — Assessment & Plan Note (Signed)
lipid control important in reducing the progression of atherosclerotic disease. Continue statin therapy  

## 2020-09-25 NOTE — Progress Notes (Signed)
Patient ID: Cody Mu., male   DOB: December 25, 1945, 75 y.o.   MRN: [id number]  Chief Complaint  Patient presents with   New Patient (Initial Visit)    Ref Cody Fritz significant claudication    HPI Cody Fritz. is a 75 y.o. male.  I am asked to see the patient by Dr. Nehemiah Fritz for evaluation of left leg claudication symptoms.  This starts up in his hip and buttock and radiates down his left leg.  He can only walk 30 to 40 yards before having to stop and rest.  He has no significant right leg symptoms.  He has no ulceration or rest pain.  He does have a significant cardiac history and has had 2 cardiac stents placed in the past.  The patient denies any clear inciting event or causative factor that started the symptoms.  It started somewhat insidiously and has progressed to the point where symptoms are quite severe on the left leg.  To evaluate him for a cause of his claudication symptoms, ABIs were performed today.  These were normal at rest at 1.21 on the right and 1.13 on the left with multiphasic waveforms and normal digital pressures and waveforms bilaterally.    Past Medical History:  Diagnosis Date   Anemia    Arthritis    Coronary artery disease    GERD (gastroesophageal reflux disease)    High cholesterol    Hypertension    Malignant neoplasm of urinary bladder ([place]) 08/09/2019   MI (myocardial infarction) ([place]) 01/2014    Past Surgical History:  Procedure Laterality Date   CORONARY ANGIOPLASTY WITH STENT PLACEMENT     CYSTOSCOPY WITH BIOPSY N/A 04/18/2018   Procedure: CYSTOSCOPY WITH BLADDER BIOPSY;  Surgeon: Billey Co, MD;  Location: [facility];  Service: Urology;  Laterality: N/A;   CYSTOSCOPY WITH FULGERATION N/A 04/18/2018   Procedure: CYSTOSCOPY WITH FULGERATION;  Surgeon: Billey Co, MD;  Location: [facility];  Service: Urology;  Laterality: N/A;   ROTATOR CUFF REPAIR Right 1996   SHOULDER ARTHROSCOPY WITH OPEN ROTATOR CUFF REPAIR Left 03/17/2016    Procedure: SHOULDER ARTHROSCOPY WITH OPEN ROTATOR CUFF REPAIR, DEBRIDEMENT DECOMPRESSION, TENDONESIS;  Surgeon: Corky Mull, MD;  Location: [facility];  Service: Orthopedics;  Laterality: Left;   TONSILLECTOMY       Family History  Problem Relation Age of Onset   Hypertension Mother   No bleeding disorders, clotting disorders, or aneurysms   Social History   Tobacco Use   Smoking status: Former    Packs/day: 1.00    Types: Cigarettes    Quit date: 01/06/2014    Years since quitting: 6.7   Smokeless tobacco: Never  Vaping Use   Vaping Use: Never used  Substance Use Topics   Alcohol use: No   Drug use: No     Allergies  Allergen Reactions   Lisinopril Swelling    Current Outpatient Medications  Medication Sig Dispense Refill   aspirin EC 81 MG tablet Take 81 mg by mouth at bedtime.     atorvastatin (LIPITOR) 80 MG tablet Take 80 mg by mouth at bedtime.     Ca Carbonate-Mag Hydroxide (ROLAIDS PO) Take 1-2 tablets by mouth daily as needed (heartburn).     Calcium Carbonate-Vitamin D 600-200 MG-UNIT TABS Take 1 tablet by mouth 3 (three) times a week.      carvedilol (COREG) 3.125 MG tablet Take 3.125 mg by mouth 2 (two) times daily with a meal.  furosemide (LASIX) 20 MG tablet Take by mouth as needed.     losartan (COZAAR) 25 MG tablet Take 25 mg by mouth daily.      Misc Natural Products (GLUCOSAMINE CHONDROITIN TRIPLE) TABS Take 1 tablet by mouth daily.     multivitamin (ONE-A-DAY MEN'S) TABS tablet Take 1 tablet by mouth daily.      Omega-3 Fatty Acids (FISH OIL) 1200 MG CAPS Take 1,200 mg by mouth daily.     vitamin A 10000 UNIT capsule Take 10,000 Units by mouth 3 (three) times a week.     vitamin E 400 UNIT capsule Take 400 Units by mouth 3 (three) times a week.     No current facility-administered medications for this visit.      REVIEW OF SYSTEMS (Negative unless checked)  Constitutional: '[]'$ Weight loss  '[]'$ Fever  '[]'$ Chills Cardiac: '[]'$ Chest pain   '[]'$ Chest  pressure   '[]'$ Palpitations   '[]'$ Shortness of breath when laying flat   '[]'$ Shortness of breath at rest   '[]'$ Shortness of breath with exertion. Vascular:  '[x]'$ Pain in legs with walking   '[]'$ Pain in legs at rest   '[]'$ Pain in legs when laying flat   '[x]'$ Claudication   '[]'$ Pain in feet when walking  '[]'$ Pain in feet at rest  '[]'$ Pain in feet when laying flat   '[]'$ History of DVT   '[]'$ Phlebitis   '[]'$ Swelling in legs   '[]'$ Varicose veins   '[]'$ Non-healing ulcers Pulmonary:   '[]'$ Uses home oxygen   '[]'$ Productive cough   '[]'$ Hemoptysis   '[]'$ Wheeze  '[]'$ COPD   '[]'$ Asthma Neurologic:  '[]'$ Dizziness  '[]'$ Blackouts   '[]'$ Seizures   '[]'$ History of stroke   '[]'$ History of TIA  '[]'$ Aphasia   '[]'$ Temporary blindness   '[]'$ Dysphagia   '[]'$ Weakness or numbness in arms   '[]'$ Weakness or numbness in legs Musculoskeletal:  '[x]'$ Arthritis   '[]'$ Joint swelling   '[]'$ Joint pain   '[]'$ Low back pain Hematologic:  '[]'$ Easy bruising  '[]'$ Easy bleeding   '[]'$ Hypercoagulable state   '[x]'$ Anemic  '[]'$ Hepatitis Gastrointestinal:  '[]'$ Blood in stool   '[]'$ Vomiting blood  '[x]'$ Gastroesophageal reflux/heartburn   '[]'$ Abdominal pain Genitourinary:  '[]'$ Chronic kidney disease   '[]'$ Difficult urination  '[]'$ Frequent urination  '[]'$ Burning with urination   '[]'$ Hematuria Skin:  '[]'$ Rashes   '[]'$ Ulcers   '[]'$ Wounds Psychological:  '[]'$ History of anxiety   '[]'$  History of major depression.    Physical Exam BP 126/71 (BP Location: Right Arm)   Pulse 75   Resp 16   Ht '5\' 9"'$  (1.753 m)   Wt 216 lb 3.2 oz (98.1 kg)   BMI 31.93 kg/m  Gen:  WD/WN, NAD Head: [place]/AT, No temporalis wasting.  Ear/Nose/Throat: Hearing grossly intact, nares w/o erythema or drainage, oropharynx w/o Erythema/Exudate Eyes: Conjunctiva clear, sclera non-icteric  Neck: trachea midline.  No JVD.  Pulmonary:  Good air movement, respirations not labored, no use of accessory muscles  Cardiac: RRR, no JVD Vascular:  Vessel Right Left  Radial Palpable Palpable                          DP palpable palpable  PT palpable palpable    Musculoskeletal: M/S 5/5  throughout.  Extremities without ischemic changes.  No deformity or atrophy. No edema. Neurologic: Sensation grossly intact in extremities.  Symmetrical.  Speech is fluent. Motor exam as listed above. Psychiatric: Judgment intact, Mood & affect appropriate for pt's clinical situation. Dermatologic: No rashes or ulcers noted.  No cellulitis or open wounds.    Radiology No results found.  Labs No results  found for this or any previous visit (from the past 2160 hour(s)).  Assessment/Plan:  Claudication ([place]) ABIs were performed today.  These were normal at rest at 1.21 on the right and 1.13 on the left with multiphasic waveforms and normal digital pressures and waveforms bilaterally.  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  No further vascular work-up is planned at this time.  I will defer evaluation and management of his neurogenic claudication to his primary care physician. I will see him back PRN  Benign essential HTN blood pressure control important in reducing the progression of atherosclerotic disease. On appropriate oral medications.   Hyperlipidemia, mixed lipid control important in reducing the progression of atherosclerotic disease. Continue statin therapy      Leotis Pain 09/25/2020, 9:34 AM   This note was created with Dragon medical transcription system.  Any errors from dictation are unintentional.

## 2020-09-25 NOTE — Patient Instructions (Signed)
[employer]'s Heart Disease: A Textbook of Cardiovascular Medicine (11th ed., pp. [id number]). [place], [place]: Elsevier.">  Intermittent Claudication Intermittent claudication is pain in one leg or both legs that occurs when walking or exercising and goes away when resting. This condition is a symptom of peripheral vascular disease (PVD). PVD is a disease of the blood vessels. This condition is commonly treated with physical activity, medicine, and lifestyle changes. If medical management does not improve symptoms, surgery may be done to restore blood flow. This surgery is called revascularization. What are the causes?  This condition is caused by a buildup of fatty material and other substances (plaque) within the arteries (atherosclerosis). Plaque makes arteries stiff and narrow, preventing proper blood flow to the leg muscles. Pain occurs when you walk or exercise because your muscles need more blood when you are moving and exercising but cannot get it because of poorblood flow. What increases the risk? The following factors may make you more likely to develop this condition: Smoking cigarettes. A personal history of stroke or heart disease. Age. The older you are, the higher the risk. Being inactive (sedentary lifestyle) or being overweight. A family history of atherosclerosis. Having another health condition, such as: Diabetes. High blood pressure. High cholesterol. What are the signs or symptoms? Symptoms of this condition can be in one leg or both legs, and can occur in the feet, calf, thigh, hip, or buttock over time. Symptoms may include: Aches or pains when walking. Cramps. A feeling of tightness, weakness, or heaviness. A wound on the lower leg or foot that heals poorly or does not heal. How is this diagnosed? This condition may be diagnosed based on: Your symptoms. Your medical history. A physical exam. Tests, such as: Ankle-brachial index (ABI) to check blood pressure in the  legs and compare it to the pressure in the arms. Exercise ankle-brachial test. For this test, you walk on a treadmill. Tests are done to check how the condition affects your ability to walk or exercise. Arterial duplex ultrasound to view how blood flows within arteries. CT angiogram (CTA). An X-ray machine is used to take pictures of the blood vessels after dye is injected. Magnetic resonance angiogram (MRA). This creates images of blood vessels and blood flow within them. Angiogram. In this procedure, dye is injected into arteries and then X-rays are taken. Blood tests. How is this treated? Treatment for this condition involves treating the underlying cause and managing risk factors, such as high blood pressure, high cholesterol, or diabetes. Treatment may include: Lifestyle changes, such as: Starting a supervised or home-based exercise program. Losing weight. Quitting smoking. Medicines to help restore blood flow through your legs. If you have symptoms that affect your everyday activities, or have a wound that is not healing, treatment may include: Angioplasty to open a blocked artery using an inflated balloon. Stent implant to open a blocked artery using a mesh-like tube. Surgery to restore blood flow by creating a bypass around a blocked area. Follow these instructions at home: Lifestyle  Maintain a healthy weight. Eat a diet that is low in saturated fats and calories. Consider working with a dietitianto help you make healthy food choices. Do not use any products that contain nicotine or tobacco. These products include cigarettes, chewing tobacco, and vaping devices, such as e-cigarettes. If you need help quitting, ask your health care provider. If your health care provider recommended an exercise program for you, follow it as directed. Your exercise program may involve: Walking three or more times a  week. Walking until you have certain symptoms of intermittent claudication, resting  until your symptoms go away, and then resuming your walk. Gradually increasing your total walking time to about 50 minutes a day.  General instructions Work with your health care provider to manage other health conditions that may increase your risk, including diabetes, high blood pressure, or high cholesterol. Take over-the-counter and prescription medicines only as told by your health care provider. Keep all follow-up visits. This is important. Contact a health care provider if: Your pain does not go away with rest. You have sores on your legs that do not heal or have pus or a bad smell. Your condition gets worse or does not improve with treatment. Get help right away if: You have chest pain. You have trouble breathing. Your foot or leg is cold or it changes color. Your foot or leg becomes numb. You have any symptoms of a stroke. "BE FAST" is an easy way to remember the main warning signs of a stroke: B - Balance. Signs are dizziness, sudden trouble walking, or loss of balance. E - Eyes. Signs are trouble seeing or a sudden change in vision. F - Face. Signs are sudden weakness or numbness of the face, or the face or eyelid drooping on one side. A - Arms. Signs are weakness or numbness in an arm. This happens suddenly and usually on one side of the body. S - Speech. Signs are sudden trouble speaking, slurred speech, or trouble understanding what people say. T - Time. Time to call emergency services. Write down what time symptoms started. You have other signs of a stroke, such as: A sudden, severe headache with no known cause. Nausea or vomiting. Seizure. These symptoms may represent a serious problem that is an emergency. Do not wait to see if the symptoms resolve. Get medical help right away. Call your local emergency services (911 in the [place]). Do not drive yourself to the hospital. Summary Intermittent claudication is pain in the leg or legs that occurs when walking or exercising and  goes away when resting. This condition is caused by a buildup of plaque in the arteries. Plaque makes arteries stiff and narrow, which prevents proper blood flow to the leg. Intermittent claudication can be treated with medicine and lifestyle changes. If these fail, surgery may be done to restore blood flow to the affected area. Work with your health care provider to manage other health conditions you may have, including diabetes, high blood pressure, or high cholesterol. This information is not intended to replace advice given to you by your health care provider. Make sure you discuss any questions you have with your healthcare provider. Document Revised: 08/26/2019 Document Reviewed: 08/26/2019 Elsevier Patient Education  [place].

## 2020-09-25 NOTE — Assessment & Plan Note (Signed)
ABIs were performed today.  These were normal at rest at 1.21 on the right and 1.13 on the left with multiphasic waveforms and normal digital pressures and waveforms bilaterally.  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  No further vascular work-up is planned at this time.  I will defer evaluation and management of his neurogenic claudication to his primary care physician. I will see him back PRN

## 2024-01-23 ENCOUNTER — Encounter: Payer: Self-pay | Admitting: Ophthalmology

## 2024-01-23 NOTE — Discharge Instructions (Signed)

## 2024-01-23 NOTE — Anesthesia Preprocedure Evaluation (Signed)
 Anesthesia Evaluation  Patient identified by MRN, date of birth, ID band Patient awake    Reviewed: Allergy & Precautions, H&P , NPO status , Patient's Chart, lab work & pertinent test results  Airway Mallampati: I  TM Distance: >3 FB Neck ROM: Full    Dental no notable dental hx. (+) Edentulous Upper, Edentulous Lower, Lower Dentures, Upper Dentures   Pulmonary neg pulmonary ROS, former smoker   Pulmonary exam normal breath sounds clear to auscultation       Cardiovascular hypertension, + CAD, + Past MI and + Peripheral Vascular Disease  negative cardio ROS Normal cardiovascular exam Rhythm:Regular Rate:Normal  Echo 05/18/23 CONCLUSION ------------------------------------------------------------------------------- NORMAL LEFT VENTRICULAR SYSTOLIC FUNCTION WITH MILD LVH ESTIMATED EF: >55% NORMAL LA PRESSURES WITH DIASTOLIC DYSFUNCTION (GRADE 1) NORMAL RIGHT VENTRICULAR SYSTOLIC FUNCTION VALVULAR REGURGITATION: TRIVIAL AR, TRIVIAL MR, TRIVIAL PR, TRIVIAL TR NO VALVULAR STENOSIS   S/p PCI and DES to RCA 2015   Neuro/Psych negative neurological ROS  negative psych ROS   GI/Hepatic negative GI ROS, Neg liver ROS,GERD  ,,  Endo/Other  negative endocrine ROS    Renal/GU negative Renal ROS  negative genitourinary   Musculoskeletal negative musculoskeletal ROS (+) Arthritis ,    Abdominal   Peds negative pediatric ROS (+)  Hematology negative hematology ROS (+) Blood dyscrasia, anemia   Anesthesia Other Findings Medical History  High cholesterol  Coronary artery disease GERD (gastroesophageal reflux disease) MI (myocardial infarction) ([facility]) Hypertension  Arthritis Anemia  Malignant neoplasm of urinary bladder ([facility]) Coronary artery disease involving native coronary artery of native heart without angina pectoris  Atherosclerotic peripheral vascular disease with intermittent claudication ST elevation myocardial  infarction (STEMI) of inferior wall in 2015  Moderate mitral insufficiency CAD S/P percutaneous coronary angioplasty in 2015 Bilateral carotid artery stenosis Former smoker  Grade I diastolic dysfunction Presence of drug-eluting stent in right coronary artery  History of percutaneous coronary intervention in 2015    Reproductive/Obstetrics negative OB ROS                              Anesthesia Physical Anesthesia Plan  ASA: 3  Anesthesia Plan: MAC   Post-op Pain Management:    Induction: Intravenous  PONV Risk Score and Plan:   Airway Management Planned: Natural Airway and Nasal Cannula  Additional Equipment:   Intra-op Plan:   Post-operative Plan:   Informed Consent: I have reviewed the patients History and Physical, chart, labs and discussed the procedure including the risks, benefits and alternatives for the proposed anesthesia with the patient or authorized representative who has indicated his/her understanding and acceptance.     Dental Advisory Given  Plan Discussed with: Anesthesiologist, CRNA and Surgeon  Anesthesia Plan Comments: (Patient consented for risks of anesthesia including but not limited to:  - adverse reactions to medications - damage to eyes, teeth, lips or other oral mucosa - nerve damage due to positioning  - sore throat or hoarseness - Damage to heart, brain, nerves, lungs, other parts of body or loss of life  Patient voiced understanding and assent.)         Anesthesia Quick Evaluation

## 2024-01-25 ENCOUNTER — Other Ambulatory Visit: Payer: Self-pay

## 2024-01-25 ENCOUNTER — Ambulatory Visit
Admission: RE | Admit: 2024-01-25 | Discharge: 2024-01-25 | Disposition: A | Discharge status: Home / Self Care | Attending: Ophthalmology | Admitting: Ophthalmology

## 2024-01-25 ENCOUNTER — Encounter: Payer: Self-pay | Admitting: Ophthalmology

## 2024-01-25 ENCOUNTER — Ambulatory Visit: Admitting: Anesthesiology

## 2024-01-25 ENCOUNTER — Encounter
Admission: RE | Disposition: A | Discharge status: Home / Self Care | Payer: Self-pay | Source: Home / Self Care | Attending: Ophthalmology

## 2024-01-25 DIAGNOSIS — Z87891 Personal history of nicotine dependence: Secondary | ICD-10-CM | POA: Insufficient documentation

## 2024-01-25 DIAGNOSIS — I10 Essential (primary) hypertension: Secondary | ICD-10-CM | POA: Insufficient documentation

## 2024-01-25 DIAGNOSIS — M1990 Unspecified osteoarthritis, unspecified site: Secondary | ICD-10-CM | POA: Insufficient documentation

## 2024-01-25 DIAGNOSIS — K219 Gastro-esophageal reflux disease without esophagitis: Secondary | ICD-10-CM | POA: Diagnosis not present

## 2024-01-25 DIAGNOSIS — I739 Peripheral vascular disease, unspecified: Secondary | ICD-10-CM | POA: Insufficient documentation

## 2024-01-25 DIAGNOSIS — I2510 Atherosclerotic heart disease of native coronary artery without angina pectoris: Secondary | ICD-10-CM | POA: Diagnosis not present

## 2024-01-25 DIAGNOSIS — Z955 Presence of coronary angioplasty implant and graft: Secondary | ICD-10-CM | POA: Diagnosis not present

## 2024-01-25 DIAGNOSIS — I252 Old myocardial infarction: Secondary | ICD-10-CM | POA: Diagnosis not present

## 2024-01-25 DIAGNOSIS — Z79899 Other long term (current) drug therapy: Secondary | ICD-10-CM | POA: Diagnosis not present

## 2024-01-25 DIAGNOSIS — H2512 Age-related nuclear cataract, left eye: Secondary | ICD-10-CM | POA: Insufficient documentation

## 2024-01-25 DIAGNOSIS — D649 Anemia, unspecified: Secondary | ICD-10-CM | POA: Insufficient documentation

## 2024-01-25 HISTORY — DX: Personal history of nicotine dependence: Z87.891

## 2024-01-25 HISTORY — DX: Atherosclerosis of native arteries of extremities with intermittent claudication, unspecified extremity: I70.219

## 2024-01-25 HISTORY — DX: Other ill-defined heart diseases: I51.89

## 2024-01-25 HISTORY — DX: Bronchitis, not specified as acute or chronic: J40

## 2024-01-25 HISTORY — DX: Nonrheumatic mitral (valve) insufficiency: I34.0

## 2024-01-25 HISTORY — DX: ST elevation (STEMI) myocardial infarction involving other coronary artery of inferior wall: I21.19

## 2024-01-25 HISTORY — PX: CATARACT EXTRACTION W/PHACO: SHX586

## 2024-01-25 HISTORY — DX: Atherosclerotic heart disease of native coronary artery without angina pectoris: I25.10

## 2024-01-25 SURGERY — PHACOEMULSIFICATION, CATARACT, WITH IOL INSERTION
Anesthesia: Monitor Anesthesia Care | Site: Eye | Laterality: Left

## 2024-01-25 MED ORDER — SIGHTPATH DOSE#1 BSS IO SOLN
INTRAOCULAR | Status: DC | PRN
  Administered 2024-01-25: 99 mL via OPHTHALMIC

## 2024-01-25 MED ORDER — TETRACAINE HCL 0.5 % OP SOLN
1.0000 [drp] | OPHTHALMIC | Status: DC | PRN
  Administered 2024-01-25 (×3): 1 [drp] via OPHTHALMIC

## 2024-01-25 MED ORDER — MOXIFLOXACIN HCL 0.5 % OP SOLN
OPHTHALMIC | Status: DC | PRN
  Administered 2024-01-25: .2 mL via OPHTHALMIC

## 2024-01-25 MED ORDER — LIDOCAINE HCL (PF) 2 % IJ SOLN
INTRAOCULAR | Status: DC | PRN
  Administered 2024-01-25: 4 mL via INTRAOCULAR

## 2024-01-25 MED ORDER — LACTATED RINGERS IV SOLN: INTRAVENOUS | Status: DC

## 2024-01-25 MED ORDER — SIGHTPATH DOSE#1 NA HYALUR & NA CHOND-NA HYALUR IO KIT
PACK | INTRAOCULAR | Status: DC | PRN
  Administered 2024-01-25: 1 via OPHTHALMIC

## 2024-01-25 MED ORDER — FENTANYL CITRATE (PF) 100 MCG/2ML IJ SOLN
INTRAMUSCULAR | Status: DC | PRN
  Administered 2024-01-25 (×2): 50 ug via INTRAVENOUS

## 2024-01-25 MED ORDER — CYCLOPENTOLATE HCL 2 % OP SOLN
OPHTHALMIC | Status: AC
Start: 2024-01-25 — End: 2024-01-25
  Filled 2024-01-25: qty 2

## 2024-01-25 MED ORDER — MIDAZOLAM HCL (PF) 2 MG/2ML IJ SOLN
INTRAMUSCULAR | Status: DC | PRN
  Administered 2024-01-25: 1 mg via INTRAVENOUS

## 2024-01-25 MED ORDER — BRIMONIDINE TARTRATE-TIMOLOL 0.2-0.5 % OP SOLN
OPHTHALMIC | Status: DC | PRN
  Administered 2024-01-25: 1 [drp] via OPHTHALMIC

## 2024-01-25 MED ORDER — FENTANYL CITRATE (PF) 100 MCG/2ML IJ SOLN
INTRAMUSCULAR | Status: AC
  Filled 2024-01-25: qty 2

## 2024-01-25 MED ORDER — PHENYLEPHRINE HCL 10 % OP SOLN
1.0000 [drp] | OPHTHALMIC | Status: AC
  Administered 2024-01-25 (×3): 1 [drp] via OPHTHALMIC

## 2024-01-25 MED ORDER — MIDAZOLAM HCL 2 MG/2ML IJ SOLN
INTRAMUSCULAR | Status: AC
  Filled 2024-01-25: qty 2

## 2024-01-25 MED ORDER — TETRACAINE HCL 0.5 % OP SOLN
OPHTHALMIC | Status: AC
  Filled 2024-01-25: qty 4

## 2024-01-25 MED ORDER — SIGHTPATH DOSE#1 BSS IO SOLN
INTRAOCULAR | Status: DC | PRN
  Administered 2024-01-25: 15 mL via INTRAOCULAR

## 2024-01-25 MED ORDER — CYCLOPENTOLATE HCL 2 % OP SOLN
1.0000 [drp] | OPHTHALMIC | Status: AC
  Administered 2024-01-25 (×2): 1 [drp] via OPHTHALMIC

## 2024-01-25 MED ORDER — PHENYLEPHRINE HCL 10 % OP SOLN
OPHTHALMIC | Status: AC
  Filled 2024-01-25: qty 5

## 2024-01-25 SURGICAL SUPPLY — 11 items
DISSECTOR HYDRO NUCLEUS 50X22 (MISCELLANEOUS) ×1 IMPLANT
DRSG TEGADERM 2-3/8X2-3/4 SM (GAUZE/BANDAGES/DRESSINGS) ×1 IMPLANT
FEE CATARACT SUITE SIGHTPATH (MISCELLANEOUS) ×1 IMPLANT
GLOVE BIOGEL PI IND STRL 8 (GLOVE) ×1 IMPLANT
GLOVE SURG LX STRL 7.5 STRW (GLOVE) ×1 IMPLANT
GLOVE SURG SYN 6.5 PF PI BL (GLOVE) ×1 IMPLANT
LENS ENVISTA  23.0 ×1 IMPLANT
LENS IOL ENVISTA  23.0 IMPLANT
NDL FILTER BLUNT 18X1 1/2 (NEEDLE) ×1 IMPLANT
NEEDLE FILTER BLUNT 18X1 1/2 (NEEDLE) ×1 IMPLANT
SYR 3ML LL SCALE MARK (SYRINGE) ×1 IMPLANT

## 2024-01-25 NOTE — H&P (Signed)
 [facility]   Primary Care Physician:  Sadie Manna, MD Ophthalmologist: Dr. Feliciano Ober  Pre-Procedure History & Physical: HPI:  Cody Loera. is a 78 y.o. male here for cataract surgery.   Past Medical History:  Diagnosis Date   Anemia    Arthritis    Atherosclerotic peripheral vascular disease with intermittent claudication    Bilateral carotid artery stenosis 2020   bilateral carotid artery stenosis 50-69% right and less than 50% left 2020.   Bronchitis    CAD S/P percutaneous coronary angioplasty    Coronary artery disease    Coronary artery disease involving native coronary artery of native heart without angina pectoris    Former smoker    quit smoking about 9 years ago. His smoking use included cigarettes. started smoking about 57 years ago. He has a 96 pack-year smoking history.   GERD (gastroesophageal reflux disease)    YEARS AGO   Grade I diastolic dysfunction    High cholesterol    History of percutaneous coronary intervention 2015   Hypertension    Malignant neoplasm of urinary bladder (HCC) 08/09/2019   MI (myocardial infarction) (HCC) 01/2014   Moderate mitral insufficiency    Presence of drug-eluting stent in right coronary artery 2015   ST elevation myocardial infarction (STEMI) of inferior wall (HCC)     Past Surgical History:  Procedure Laterality Date   CORONARY ANGIOPLASTY WITH STENT PLACEMENT     CYSTOSCOPY WITH BIOPSY N/A 04/18/2018   Procedure: CYSTOSCOPY WITH BLADDER BIOPSY;  Surgeon: Francisca Redell BROCKS, MD;  Location: [facility] ORS;  Service: Urology;  Laterality: N/A;   CYSTOSCOPY WITH FULGERATION N/A 04/18/2018   Procedure: CYSTOSCOPY WITH FULGERATION;  Surgeon: Francisca Redell BROCKS, MD;  Location: [facility];  Service: Urology;  Laterality: N/A;   ROTATOR CUFF REPAIR Right 1996   SHOULDER ARTHROSCOPY WITH OPEN ROTATOR CUFF REPAIR Left 03/17/2016   Procedure: SHOULDER ARTHROSCOPY WITH OPEN ROTATOR CUFF REPAIR, DEBRIDEMENT DECOMPRESSION,  TENDONESIS;  Surgeon: Norleen JINNY Maltos, MD;  Location: [facility] ORS;  Service: Orthopedics;  Laterality: Left;   TONSILLECTOMY      Prior to Admission medications   Medication Sig Start Date End Date Taking? Authorizing Provider  Ascorbic Acid (VITAMIN C) 1000 MG tablet Take 1,000 mg by mouth daily.   Yes [provider]  B COMPLEX-C-FOLIC ACID PO Take 1,000 mg by mouth daily.   Yes [provider]  CHONDROITIN SULFATE PO Take 600 mg by mouth daily.   Yes [provider]  DHEA 50 MG TABS Take 100 mg by mouth daily.   Yes [provider]  EQL LUTEIN PO Take 1 tablet by mouth daily.   Yes [provider]  Glucosamine 750 MG TABS Take 1 tablet by mouth daily.   Yes [provider]  IRON, FERROUS GLUCONATE, PO Take 27 mg by mouth daily.   Yes [provider]  NON FORMULARY Take 1,200 mg by mouth daily. CISSIS QUADRANGULARIS   Yes [provider]  NON FORMULARY Take 1,000 mg by mouth daily. SPIRULNA   Yes [provider]  Omega-3 Fatty Acids (FISH OIL OMEGA-3 PO) Take 3,000 mg by mouth daily.   Yes [provider]  Omega-3 Fatty Acids (OMEGA 3 PO) Take 900 mg by mouth daily.   Yes [provider]  Potassium 99 MG TABS Take 1 tablet by mouth daily.   Yes [provider]  Turmeric (QC TUMERIC COMPLEX PO) Take 2,000 mg by mouth daily.   Yes  [provider]  Zinc 30 MG CAPS Take 1 capsule by mouth daily.   Yes [provider]  atorvastatin (LIPITOR) 80 MG tablet Take 80 mg by mouth at bedtime. 08/31/14   [provider]  Ca Carbonate-Mag Hydroxide (ROLAIDS PO) Take 1-2 tablets by mouth daily as needed (heartburn).    [provider]  Calcium Carbonate-Vitamin D 600-200 MG-UNIT TABS Take 1 tablet by mouth 3 (three) times a week.     [provider]  carvedilol (COREG) 3.125 MG tablet Take 3.125 mg by mouth 2 (two) times daily with a meal.    [provider]  losartan (COZAAR) 25 MG tablet Take 25 mg by mouth daily.  04/03/18   [provider]    Allergies as of 12/15/2023 - Review Complete 09/25/2020  Allergen Reaction Noted   Lisinopril Swelling 04/03/2018    Family History  Problem Relation Age of Onset   Hypertension Mother     Social History   Socioeconomic History   Marital status: Single    Spouse name: Not on file   Number of children: Not on file   Years of education: Not on file   Highest education level: Not on file  Occupational History   Not on file  Tobacco Use   Smoking status: Former    Current packs/day: 0.00    Types: Cigarettes    Quit date: 01/06/2014    Years since quitting: 10.0   Smokeless tobacco: Never  Vaping Use   Vaping status: Never Used  Substance and Sexual Activity   Alcohol use: No   Drug use: No   Sexual activity: Yes    Birth control/protection: None  Other Topics Concern   Not on file  Social History Narrative   Not on file   Social Drivers of Health   Financial Resource Strain: Low Risk  (08/08/2023)   Received from [facility]   Overall Financial Resource Strain (CARDIA)    Difficulty of Paying Living Expenses: Not hard at all  Food Insecurity: No Food Insecurity (08/08/2023)   Received from [facility]   Hunger Vital Sign    Within the past 12 months, you worried that your food would run out before you got the money to buy more.: Never true    Within the past 12 months, the food you bought just didn't last and you didn't have money to get more.: Never true  Transportation Needs: No Transportation Needs (08/08/2023)   Received from [facility] - Transportation    In the past 12 months, has lack of transportation kept you from medical appointments or from getting medications?: No    Lack of Transportation (Non-Medical): No  Physical Activity: Not on file  Stress: Not on file  Social Connections:  Not on file  Intimate Partner Violence: Not on file    Review of Systems: See HPI, otherwise negative ROS  Physical Exam: Ht 5' 9 (1.753 m)   Wt 99.8 kg   BMI 32.49 kg/m  General:   Alert, cooperative in NAD Head:  Normocephalic and atraumatic. Respiratory:  Normal work of breathing. Cardiovascular:  RRR  Impression/Plan: Cody Fritz. is here for cataract surgery.  Risks, benefits, limitations, and alternatives regarding cataract surgery have been reviewed with the patient.  Questions have been answered.  All parties agreeable.   Feliciano Bryan Ober, MD  01/25/2024, 7:25 AM

## 2024-01-25 NOTE — Op Note (Signed)
 OPERATIVE NOTE  Dimitrios Balestrieri [id number] 01/25/2024   PREOPERATIVE DIAGNOSIS: Nuclear sclerotic cataract left eye. H25.12   POSTOPERATIVE DIAGNOSIS: Nuclear sclerotic cataract left eye. H25.12   PROCEDURE:  Phacoemulsification with posterior chamber intraocular lens placement of the left eye  Ultrasound time: Procedure(s): PHACOEMULSIFICATION, CATARACT, WITH IOL INSERTION 23.13 01:54.9 (Left)  LENS:   Implant Name Type Inv. Item Serial No. Manufacturer Lot No. LRB No. Used Action  ENVISTA [id number] Intraocular Lens  [id number] [employer] [id number] Left 1 Implanted      SURGEON:  Feliciano HERO. Enola, MD   ANESTHESIA:  Topical with tetracaine drops, augmented with 1% preservative-free intracameral lidocaine .   COMPLICATIONS:  None.   DESCRIPTION OF PROCEDURE:  The patient was identified in the holding room and transported to the operating room and placed in the supine position under the operating microscope.  The left eye was identified as the operative eye, which was prepped and draped in the usual sterile ophthalmic fashion.   A 1 millimeter clear-corneal paracentesis was made inferotemporally. Preservative-free 1% lidocaine  mixed with 1:1,000 bisulfite-free aqueous solution of epinephrine  was injected into the anterior chamber. The anterior chamber was then filled with Viscoat viscoelastic. A 2.4 millimeter keratome was used to make a clear-corneal incision superotemporally. A curvilinear capsulorrhexis was made with a cystotome and capsulorrhexis forceps. Balanced salt solution was used to hydrodissect and hydrodelineate the nucleus. Phacoemulsification was then used to remove the lens nucleus and epinucleus. The remaining cortex was then removed using the irrigation and aspiration handpiece. Provisc was then placed into the capsular bag to distend it for lens placement. A +23.50 D [id number] (EE) intraocular lens was then injected into the capsular bag. The remaining viscoelastic  was aspirated.   Wounds were hydrated with balanced salt solution.  The anterior chamber was inflated to a physiologic pressure with balanced salt solution.  No wound leaks were noted. Moxifloxacin was injected intracamerally.  Timolol and Brimonidine drops were applied to the eye.  The patient was taken to the recovery room in stable condition without complications of anesthesia or surgery.  Feliciano Hugger [employer] 01/25/2024, 12:21 PM

## 2024-01-25 NOTE — Transfer of Care (Signed)
 Immediate Anesthesia Transfer of Care Note  Patient: Cody Fritz.  Procedure(s) Performed: PHACOEMULSIFICATION, CATARACT, WITH IOL INSERTION 23.13 01:54.9 (Left: Eye)  Patient Location: PACU  Anesthesia Type: MAC  Level of Consciousness: awake, alert  and patient cooperative  Airway and Oxygen Therapy: Patient Spontanous Breathing   Post-op Assessment: Post-op Vital signs reviewed, Patient's Cardiovascular Status Stable, Respiratory Function Stable, Patent Airway and No signs of Nausea or vomiting  Post-op Vital Signs: Reviewed and stable  Complications: No notable events documented.

## 2024-01-25 NOTE — Anesthesia Postprocedure Evaluation (Signed)
 Anesthesia Post Note  Patient: Cody Fritz.  Procedure(s) Performed: PHACOEMULSIFICATION, CATARACT, WITH IOL INSERTION 23.13 01:54.9 (Left: Eye)  Patient location during evaluation: PACU Anesthesia Type: MAC Level of consciousness: awake and alert Pain management: pain level controlled Vital Signs Assessment: post-procedure vital signs reviewed and stable Respiratory status: spontaneous breathing, nonlabored ventilation, respiratory function stable and patient connected to nasal cannula oxygen Cardiovascular status: stable and blood pressure returned to baseline Postop Assessment: no apparent nausea or vomiting Anesthetic complications: no   No notable events documented.   Last Vitals:  Vitals:   01/25/24 1223 01/25/24 1229  BP: 134/61 (!) 141/62  Pulse: 66 (!) 57  Resp: 18 11  Temp: 36.7 C 36.7 C  SpO2: 99% 98%    Last Pain:  Vitals:   01/25/24 1229  TempSrc:   PainSc: 0-No pain                 Cody Fritz

## 2024-01-26 ENCOUNTER — Encounter: Payer: Self-pay | Admitting: Ophthalmology

## 2024-02-05 NOTE — Discharge Instructions (Signed)

## 2024-02-05 NOTE — Anesthesia Preprocedure Evaluation (Signed)
 Anesthesia Evaluation  Patient identified by MRN, date of birth, ID band Patient awake    Reviewed: Allergy & Precautions, H&P , NPO status , Patient's Chart, lab work & pertinent test results  Airway Mallampati: I  TM Distance: >3 FB Neck ROM: Full    Dental no notable dental hx. (+) Edentulous Lower, Edentulous Upper, Lower Dentures, Upper Dentures Edentulous Upper, Edentulous Lower, Lower Dentures, Upper Dentures   :   Pulmonary former smoker   Pulmonary exam normal breath sounds clear to auscultation       Cardiovascular hypertension, + CAD, + Past MI and + Peripheral Vascular Disease  Normal cardiovascular exam Rhythm:Regular Rate:Normal  Echo 05/18/23 CONCLUSION ------------------------------------------------------------------------------- NORMAL LEFT VENTRICULAR SYSTOLIC FUNCTION WITH MILD LVH ESTIMATED EF: >55% NORMAL LA PRESSURES WITH DIASTOLIC DYSFUNCTION (GRADE 1) NORMAL RIGHT VENTRICULAR SYSTOLIC FUNCTION VALVULAR REGURGITATION: TRIVIAL AR, TRIVIAL MR, TRIVIAL PR, TRIVIAL TR NO VALVULAR STENOSIS    S/p PCI and DES to RCA 2015    Neuro/Psych negative neurological ROS  negative psych ROS   GI/Hepatic negative GI ROS, Neg liver ROS,GERD  ,,  Endo/Other  negative endocrine ROS    Renal/GU negative Renal ROS  negative genitourinary   Musculoskeletal negative musculoskeletal ROS (+) Arthritis ,    Abdominal   Peds negative pediatric ROS (+)  Hematology negative hematology ROS (+) Blood dyscrasia, anemia   Anesthesia Other Findings Previous cataract surgery 01-25-24 Dr. Ola  High cholesterol       Coronary artery disease GERD (gastroesophageal reflux disease) MI (myocardial infarction) (HCC) Hypertension             Arthritis Anemia             Malignant neoplasm of urinary bladder (HCC) Coronary artery disease involving native coronary artery of native heart without angina pectoris        Atherosclerotic peripheral vascular disease with intermittent claudication ST elevation myocardial infarction (STEMI) of inferior wall in 2015        Moderate mitral insufficiency CAD S/P percutaneous coronary angioplasty in 2015 Bilateral carotid artery stenosis Former smoker            Grade I diastolic dysfunction Presence of drug-eluting stent in right coronary artery  History of percutaneous coronary intervention in 2015     Reproductive/Obstetrics negative OB ROS                              Anesthesia Physical Anesthesia Plan  ASA: 3  Anesthesia Plan: MAC   Post-op Pain Management:    Induction: Intravenous  PONV Risk Score and Plan:   Airway Management Planned: Natural Airway and Nasal Cannula  Additional Equipment:   Intra-op Plan:   Post-operative Plan:   Informed Consent: I have reviewed the patients History and Physical, chart, labs and discussed the procedure including the risks, benefits and alternatives for the proposed anesthesia with the patient or authorized representative who has indicated his/her understanding and acceptance.     Dental Advisory Given  Plan Discussed with: Anesthesiologist, CRNA and Surgeon  Anesthesia Plan Comments: (Patient consented for risks of anesthesia including but not limited to:  - adverse reactions to medications - damage to eyes, teeth, lips or other oral mucosa - nerve damage due to positioning  - sore throat or hoarseness - Damage to heart, brain, nerves, lungs, other parts of body or loss of life  Patient voiced understanding and assent.)  Anesthesia Quick Evaluation

## 2024-02-08 ENCOUNTER — Ambulatory Visit: Admitting: Anesthesiology

## 2024-02-08 ENCOUNTER — Encounter
Admission: RE | Disposition: A | Discharge status: Home / Self Care | Payer: Self-pay | Source: Home / Self Care | Attending: Ophthalmology

## 2024-02-08 ENCOUNTER — Other Ambulatory Visit: Payer: Self-pay

## 2024-02-08 ENCOUNTER — Encounter: Payer: Self-pay | Admitting: Ophthalmology

## 2024-02-08 ENCOUNTER — Ambulatory Visit
Admission: RE | Admit: 2024-02-08 | Discharge: 2024-02-08 | Disposition: A | Attending: Ophthalmology | Admitting: Ophthalmology

## 2024-02-08 HISTORY — PX: CATARACT EXTRACTION W/PHACO: SHX586

## 2024-02-08 SURGERY — PHACOEMULSIFICATION, CATARACT, WITH IOL INSERTION
Anesthesia: Topical | Site: Eye | Laterality: Right

## 2024-02-08 MED ORDER — FENTANYL CITRATE (PF) 100 MCG/2ML IJ SOLN
INTRAMUSCULAR | Status: AC
  Filled 2024-02-08: qty 2

## 2024-02-08 MED ORDER — MIDAZOLAM HCL 2 MG/2ML IJ SOLN
INTRAMUSCULAR | Status: AC
  Filled 2024-02-08: qty 2

## 2024-02-08 MED ORDER — CYCLOPENTOLATE HCL 2 % OP SOLN
OPHTHALMIC | Status: AC
  Filled 2024-02-08: qty 2

## 2024-02-08 MED ORDER — SIGHTPATH DOSE#1 BSS IO SOLN
INTRAOCULAR | Status: DC | PRN
  Administered 2024-02-08: 15 mL via INTRAOCULAR

## 2024-02-08 MED ORDER — BRIMONIDINE TARTRATE-TIMOLOL 0.2-0.5 % OP SOLN
OPHTHALMIC | Status: DC | PRN
  Administered 2024-02-08: 1 [drp] via OPHTHALMIC

## 2024-02-08 MED ORDER — TETRACAINE HCL 0.5 % OP SOLN
OPHTHALMIC | Status: AC
  Filled 2024-02-08: qty 4

## 2024-02-08 MED ORDER — LACTATED RINGERS IV SOLN: INTRAVENOUS | Status: DC

## 2024-02-08 MED ORDER — FENTANYL CITRATE (PF) 100 MCG/2ML IJ SOLN
INTRAMUSCULAR | Status: DC | PRN
  Administered 2024-02-08 (×2): 50 ug via INTRAVENOUS

## 2024-02-08 MED ORDER — MIDAZOLAM HCL (PF) 2 MG/2ML IJ SOLN
INTRAMUSCULAR | Status: DC | PRN
  Administered 2024-02-08: 1 mg via INTRAVENOUS

## 2024-02-08 MED ORDER — PHENYLEPHRINE HCL 10 % OP SOLN
1.0000 [drp] | OPHTHALMIC | Status: DC | PRN
  Administered 2024-02-08 (×3): 1 [drp] via OPHTHALMIC

## 2024-02-08 MED ORDER — SIGHTPATH DOSE#1 BSS IO SOLN
INTRAOCULAR | Status: DC | PRN
  Administered 2024-02-08: 118 mL via OPHTHALMIC

## 2024-02-08 MED ORDER — MOXIFLOXACIN HCL 0.5 % OP SOLN
OPHTHALMIC | Status: DC | PRN
  Administered 2024-02-08: .2 mL via OPHTHALMIC

## 2024-02-08 MED ORDER — TETRACAINE HCL 0.5 % OP SOLN
1.0000 [drp] | OPHTHALMIC | Status: DC | PRN
  Administered 2024-02-08 (×3): 1 [drp] via OPHTHALMIC

## 2024-02-08 MED ORDER — PHENYLEPHRINE HCL 10 % OP SOLN
OPHTHALMIC | Status: AC
  Filled 2024-02-08: qty 5

## 2024-02-08 MED ORDER — LIDOCAINE HCL (PF) 2 % IJ SOLN
INTRAOCULAR | Status: DC | PRN
  Administered 2024-02-08: 4 mL via INTRAOCULAR

## 2024-02-08 MED ORDER — CYCLOPENTOLATE HCL 2 % OP SOLN
1.0000 [drp] | OPHTHALMIC | Status: DC | PRN
  Administered 2024-02-08 (×3): 1 [drp] via OPHTHALMIC

## 2024-02-08 MED ORDER — SIGHTPATH DOSE#1 NA HYALUR & NA CHOND-NA HYALUR IO KIT
PACK | INTRAOCULAR | Status: DC | PRN
  Administered 2024-02-08: 1 via OPHTHALMIC

## 2024-02-08 SURGICAL SUPPLY — 9 items
DISSECTOR HYDRO NUCLEUS 50X22 (MISCELLANEOUS) ×1 IMPLANT
DRSG TEGADERM 2-3/8X2-3/4 SM (GAUZE/BANDAGES/DRESSINGS) ×1 IMPLANT
FEE CATARACT SUITE SIGHTPATH (MISCELLANEOUS) ×1 IMPLANT
GLOVE BIOGEL PI IND STRL 8 (GLOVE) ×1 IMPLANT
GLOVE SURG LX STRL 7.5 STRW (GLOVE) ×1 IMPLANT
GLOVE SURG SYN 6.5 PF PI BL (GLOVE) ×1 IMPLANT
LENS IOL ENVISTA ASPR 23.5 (Intraocular Lens) IMPLANT
NDL FILTER BLUNT 18X1 1/2 (NEEDLE) ×1 IMPLANT
SYR 3ML LL SCALE MARK (SYRINGE) ×1 IMPLANT

## 2024-02-08 NOTE — Op Note (Signed)
 OPERATIVE NOTE  Cody Fritz [id number] 02/08/2024   PREOPERATIVE DIAGNOSIS: Nuclear sclerotic cataract right eye. H25.11   POSTOPERATIVE DIAGNOSIS: Nuclear sclerotic cataract right eye. H25.11   PROCEDURE:  Phacoemulsification with posterior chamber intraocular lens placement of the right eye  Ultrasound time: Procedure(s): PHACOEMULSIFICATION, CATARACT, WITH IOL INSERTION 11.14 01.23.4 (Right)  LENS:   Implant Name Type Inv. Item Serial No. Manufacturer Lot No. LRB No. Used Action  LENS IOL ENVISTA ASPR 23.5 - [id number] Intraocular Lens LENS IOL ENVISTA ASPR 23.5 [id number] [employer]  Right 1 Implanted      SURGEON:  Feliciano HERO. Enola, MD   ANESTHESIA:  Topical with tetracaine  drops, augmented with 1% preservative-free intracameral lidocaine .   COMPLICATIONS:  None.   DESCRIPTION OF PROCEDURE:  The patient was identified in the holding room and transported to the operating room and placed in the supine position under the operating microscope.  The right eye was identified as the operative eye, which was prepped and draped in the usual sterile ophthalmic fashion.   A 1 millimeter clear-corneal paracentesis was made superotemporally. Preservative-free 1% lidocaine  mixed with 1:1,000 bisulfite-free aqueous solution of epinephrine  was injected into the anterior chamber. The anterior chamber was then filled with Viscoat viscoelastic. A 2.4 millimeter keratome was used to make a clear-corneal incision inferotemporally. A curvilinear capsulorrhexis was made with a cystotome and capsulorrhexis forceps. Balanced salt solution was used to hydrodissect and hydrodelineate the nucleus. Phacoemulsification was then used to remove the lens nucleus and epinucleus. The remaining cortex was then removed using the irrigation and aspiration handpiece. Provisc was then placed into the capsular bag to distend it for lens placement. A +23.50 D EA intraocular lens was then injected into the capsular bag.  The remaining viscoelastic was aspirated.   Wounds were hydrated with balanced salt solution.  The anterior chamber was inflated to a physiologic pressure with balanced salt solution.  No wound leaks were noted. Moxifloxacin  was injected intracamerally.  Timolol  and Brimonidine  drops were applied to the eye.  The patient was taken to the recovery room in stable condition without complications of anesthesia or surgery.  [employer] 02/08/2024, 9:00 AM

## 2024-02-08 NOTE — H&P (Signed)
 [facility]   Primary Care Physician:  Sadie Manna, MD Ophthalmologist: Dr. Feliciano Ober  Pre-Procedure History & Physical: HPI:  Cody Fritz. is a 78 y.o. male here for cataract surgery.   Past Medical History:  Diagnosis Date   Anemia    Arthritis    Atherosclerotic peripheral vascular disease with intermittent claudication    Bilateral carotid artery stenosis 2020   bilateral carotid artery stenosis 50-69% right and less than 50% left 2020.   Bronchitis    CAD S/P percutaneous coronary angioplasty    Coronary artery disease    Coronary artery disease involving native coronary artery of native heart without angina pectoris    Former smoker    quit smoking about 9 years ago. His smoking use included cigarettes. started smoking about 57 years ago. He has a 96 pack-year smoking history.   GERD (gastroesophageal reflux disease)    YEARS AGO   Grade I diastolic dysfunction    High cholesterol    History of percutaneous coronary intervention 2015   Hypertension    Malignant neoplasm of urinary bladder (HCC) 08/09/2019   MI (myocardial infarction) (HCC) 01/2014   Moderate mitral insufficiency    Presence of drug-eluting stent in right coronary artery 2015   ST elevation myocardial infarction (STEMI) of inferior wall [facility])     Past Surgical History:  Procedure Laterality Date   CATARACT EXTRACTION W/PHACO Left 01/25/2024   Procedure: PHACOEMULSIFICATION, CATARACT, WITH IOL INSERTION 23.13 01:54.9;  Surgeon: Ober Feliciano Hugger, MD;  Location: [facility];  Service: Ophthalmology;  Laterality: Left;   CORONARY ANGIOPLASTY WITH STENT PLACEMENT     CYSTOSCOPY WITH BIOPSY N/A 04/18/2018   Procedure: CYSTOSCOPY WITH BLADDER BIOPSY;  Surgeon: Francisca Redell BROCKS, MD;  Location: [facility] ORS;  Service: Urology;  Laterality: N/A;   CYSTOSCOPY WITH FULGERATION N/A 04/18/2018   Procedure: CYSTOSCOPY WITH FULGERATION;  Surgeon: Francisca Redell BROCKS, MD;  Location: [facility]  ORS;  Service: Urology;  Laterality: N/A;   ROTATOR CUFF REPAIR Right 1996   SHOULDER ARTHROSCOPY WITH OPEN ROTATOR CUFF REPAIR Left 03/17/2016   Procedure: SHOULDER ARTHROSCOPY WITH OPEN ROTATOR CUFF REPAIR, DEBRIDEMENT DECOMPRESSION, TENDONESIS;  Surgeon: Norleen JINNY Maltos, MD;  Location: [facility] ORS;  Service: Orthopedics;  Laterality: Left;   TONSILLECTOMY      Prior to Admission medications   Medication Sig Start Date End Date Taking? Authorizing Provider  Ascorbic Acid (VITAMIN C) 1000 MG tablet Take 1,000 mg by mouth daily.    [provider]  atorvastatin (LIPITOR) 80 MG tablet Take 80 mg by mouth at bedtime. 08/31/14   [provider]  B COMPLEX-C-FOLIC ACID PO Take 1,000 mg by mouth daily.    [provider]  Ca Carbonate-Mag Hydroxide (ROLAIDS PO) Take 1-2 tablets by mouth daily as needed (heartburn).    [provider]  Calcium Carbonate-Vitamin D 600-200 MG-UNIT TABS Take 1 tablet by mouth 3 (three) times a week.     [provider]  carvedilol (COREG) 3.125 MG tablet Take 3.125 mg by mouth 2 (two) times daily with a meal.    [provider]  CHONDROITIN SULFATE PO Take 600 mg by mouth daily.    [provider]  DHEA 50 MG TABS Take 100 mg by mouth daily.    [provider]  EQL LUTEIN PO Take 1 tablet by mouth daily.    [provider]  Glucosamine 750 MG TABS Take 1 tablet by mouth daily.    [provider]  IRON, FERROUS GLUCONATE, PO Take 27 mg by mouth daily.    [provider]  losartan (COZAAR) 25 MG tablet Take 25 mg by mouth daily.  04/03/18   [provider]  NON FORMULARY Take 1,200 mg by mouth daily. CISSIS QUADRANGULARIS    [provider]  NON FORMULARY Take 1,000 mg by mouth daily. SPIRULNA    [provider]  Omega-3 Fatty Acids (FISH OIL OMEGA-3 PO) Take 3,000 mg by mouth daily.    [provider]  Omega-3 Fatty Acids (OMEGA 3 PO) Take 900  mg by mouth daily.    [provider]  Potassium 99 MG TABS Take 1 tablet by mouth daily.    [provider]  Turmeric (QC TUMERIC COMPLEX PO) Take 2,000 mg by mouth daily.    [provider]  Zinc 30 MG CAPS Take 1 capsule by mouth daily.    [provider]    Allergies as of 12/15/2023 - Review Complete 09/25/2020  Allergen Reaction Noted   Lisinopril Swelling 04/03/2018    Family History  Problem Relation Age of Onset   Hypertension Mother     Social History   Socioeconomic History   Marital status: Single    Spouse name: Not on file   Number of children: Not on file   Years of education: Not on file   Highest education level: Not on file  Occupational History   Not on file  Tobacco Use   Smoking status: Former    Current packs/day: 0.00    Types: Cigarettes    Quit date: 01/06/2014    Years since quitting: 10.0   Smokeless tobacco: Never  Vaping Use   Vaping status: Never Used  Substance and Sexual Activity   Alcohol use: No   Drug use: No   Sexual activity: Yes    Birth control/protection: None  Other Topics Concern   Not on file  Social History Narrative   Not on file   Social Drivers of Health   Financial Resource Strain: Low Risk  (08/08/2023)   Received from [facility]   Overall Financial Resource Strain (CARDIA)    Difficulty of Paying Living Expenses: Not hard at all  Food Insecurity: No Food Insecurity (08/08/2023)   Received from [facility]   Hunger Vital Sign    Within the past 12 months, you worried that your food would run out before you got the money to buy more.: Never true    Within the past 12 months, the food you bought just didn't last and you didn't have money to get more.: Never true  Transportation Needs: No Transportation Needs (08/08/2023)   Received from [facility] - Transportation    In the past 12 months, has lack of transportation  kept you from medical appointments or from getting medications?: No    Lack of Transportation (Non-Medical): No  Physical Activity: Not on file  Stress: Not on file  Social Connections: Not on file  Intimate Partner Violence: Not on file    Review of Systems: See HPI, otherwise negative ROS  Physical Exam: There were no vitals taken for this visit. General:   Alert, cooperative in NAD Head:  Normocephalic and atraumatic. Respiratory:  Normal work of breathing. Cardiovascular:  RRR  Impression/Plan: Cody Fritz. is here for cataract surgery.  Risks, benefits, limitations, and alternatives regarding cataract surgery have been reviewed with the patient.  Questions  have been answered.  All parties agreeable.   Feliciano Bryan Ober, MD  02/08/2024, 7:13 AM

## 2024-02-08 NOTE — Transfer of Care (Signed)
 Immediate Anesthesia Transfer of Care Note  Patient: Cody Fritz.  Procedure(s) Performed: PHACOEMULSIFICATION, CATARACT, WITH IOL INSERTION 11.14 01.23.4 (Right: Eye)  Patient Location: PACU  Anesthesia Type: MAC  Level of Consciousness: awake, alert  and patient cooperative  Airway and Oxygen Therapy: Patient Spontanous Breathing   Post-op Assessment: Post-op Vital signs reviewed, Patient's Cardiovascular Status Stable, Respiratory Function Stable, Patent Airway and No signs of Nausea or vomiting  Post-op Vital Signs: Reviewed and stable  Complications: No notable events documented.

## 2024-02-08 NOTE — Anesthesia Postprocedure Evaluation (Signed)
 Anesthesia Post Note  Patient: Cody Fritz.  Procedure(s) Performed: PHACOEMULSIFICATION, CATARACT, WITH IOL INSERTION 11.14 01.23.4 (Right: Eye)  Patient location during evaluation: PACU Anesthesia Type: MAC Level of consciousness: awake and alert Pain management: pain level controlled Vital Signs Assessment: post-procedure vital signs reviewed and stable Respiratory status: spontaneous breathing, nonlabored ventilation, respiratory function stable and patient connected to nasal cannula oxygen Cardiovascular status: stable and blood pressure returned to baseline Postop Assessment: no apparent nausea or vomiting Anesthetic complications: no   No notable events documented.   Last Vitals:  Vitals:   02/08/24 0901 02/08/24 0905  BP: 133/67 133/61  Pulse: 65 60  Resp: 14 10  Temp: 36.4 C   SpO2: 100% 99%    Last Pain:  Vitals:   02/08/24 0905  TempSrc:   PainSc: 0-No pain                 Donny JAYSON Mu
